# Patient Record
Sex: Female | Born: 1935 | Race: White | Hispanic: No | Smoking: Never smoker
Health system: Southern US, Community
[De-identification: ages and names within clinical notes are randomized; demographics above are authoritative.]

## PROBLEM LIST (undated history)

## (undated) DIAGNOSIS — K449 Diaphragmatic hernia without obstruction or gangrene: Secondary | ICD-10-CM

## (undated) DIAGNOSIS — K222 Esophageal obstruction: Secondary | ICD-10-CM

## (undated) DIAGNOSIS — K219 Gastro-esophageal reflux disease without esophagitis: Secondary | ICD-10-CM

## (undated) DIAGNOSIS — I350 Nonrheumatic aortic (valve) stenosis: Secondary | ICD-10-CM

## (undated) DIAGNOSIS — I209 Angina pectoris, unspecified: Secondary | ICD-10-CM

## (undated) DIAGNOSIS — E039 Hypothyroidism, unspecified: Secondary | ICD-10-CM

## (undated) HISTORY — DX: Gastro-esophageal reflux disease without esophagitis: K21.9

## (undated) HISTORY — DX: Hypothyroidism, unspecified: E03.9

## (undated) HISTORY — DX: Diaphragmatic hernia without obstruction or gangrene: K44.9

## (undated) HISTORY — DX: Esophageal obstruction: K22.2

## (undated) HISTORY — PX: ELBOW SURGERY: SHX618

## (undated) HISTORY — PX: TONSILLECTOMY: SUR1361

## (undated) HISTORY — PX: CHOLECYSTECTOMY: SHX55

## (undated) HISTORY — DX: Angina pectoris, unspecified: I20.9

## (undated) HISTORY — DX: Nonrheumatic aortic (valve) stenosis: I35.0

## (undated) HISTORY — PX: OTHER SURGICAL HISTORY: SHX169

---

## 1966-11-06 HISTORY — PX: ABDOMINAL HYSTERECTOMY: SHX81

## 1998-10-06 LAB — HM COLONOSCOPY

## 2008-07-20 ENCOUNTER — Encounter: Payer: Self-pay | Admitting: Family Medicine

## 2008-08-05 ENCOUNTER — Encounter: Payer: Self-pay | Admitting: Family Medicine

## 2008-10-15 ENCOUNTER — Ambulatory Visit: Payer: Self-pay | Admitting: Family Medicine

## 2008-10-15 ENCOUNTER — Telehealth: Payer: Self-pay | Admitting: Family Medicine

## 2008-10-15 ENCOUNTER — Encounter: Admission: RE | Admit: 2008-10-15 | Discharge: 2008-10-15 | Payer: Self-pay | Admitting: Family Medicine

## 2008-10-15 DIAGNOSIS — M810 Age-related osteoporosis without current pathological fracture: Secondary | ICD-10-CM | POA: Insufficient documentation

## 2008-10-15 DIAGNOSIS — E039 Hypothyroidism, unspecified: Secondary | ICD-10-CM | POA: Insufficient documentation

## 2008-10-15 DIAGNOSIS — R002 Palpitations: Secondary | ICD-10-CM

## 2008-10-15 DIAGNOSIS — M545 Low back pain: Secondary | ICD-10-CM | POA: Insufficient documentation

## 2008-10-15 LAB — CONVERTED CEMR LAB
Ketones, urine, test strip: NEGATIVE
Nitrite: NEGATIVE
Urobilinogen, UA: 0.2
pH: 7

## 2008-10-19 LAB — CONVERTED CEMR LAB
ALT: 11 units/L (ref 0–35)
CO2: 27 meq/L (ref 19–32)
Calcium: 9.2 mg/dL (ref 8.4–10.5)
Chloride: 103 meq/L (ref 96–112)
Creatinine, Ser: 0.76 mg/dL (ref 0.40–1.20)
Folate: >20 ng/mL
Glucose, Bld: 102 mg/dL — ABNORMAL HIGH (ref 70–99)
Iron: 133 ug/dL (ref 42–145)
Saturation Ratios: 38 % (ref 20–55)
Total Bilirubin: 0.8 mg/dL (ref 0.3–1.2)
Vitamin B-12: 213 pg/mL (ref 211–911)

## 2008-11-12 DIAGNOSIS — H269 Unspecified cataract: Secondary | ICD-10-CM | POA: Insufficient documentation

## 2008-11-21 ENCOUNTER — Ambulatory Visit: Payer: Self-pay | Admitting: Occupational Medicine

## 2008-12-16 ENCOUNTER — Ambulatory Visit: Payer: Self-pay | Admitting: Family Medicine

## 2008-12-16 DIAGNOSIS — D510 Vitamin B12 deficiency anemia due to intrinsic factor deficiency: Secondary | ICD-10-CM

## 2008-12-17 ENCOUNTER — Telehealth: Payer: Self-pay | Admitting: Family Medicine

## 2009-02-17 ENCOUNTER — Telehealth: Payer: Self-pay | Admitting: Family Medicine

## 2009-03-03 ENCOUNTER — Ambulatory Visit: Payer: Self-pay | Admitting: Family Medicine

## 2009-03-03 DIAGNOSIS — M79609 Pain in unspecified limb: Secondary | ICD-10-CM

## 2009-03-03 DIAGNOSIS — E559 Vitamin D deficiency, unspecified: Secondary | ICD-10-CM | POA: Insufficient documentation

## 2009-03-08 LAB — CONVERTED CEMR LAB: TSH: 3.515 microintl units/mL (ref 0.350–4.500)

## 2009-03-16 ENCOUNTER — Telehealth (INDEPENDENT_AMBULATORY_CARE_PROVIDER_SITE_OTHER): Payer: Self-pay | Admitting: *Deleted

## 2009-08-25 ENCOUNTER — Ambulatory Visit: Payer: Self-pay | Admitting: Family Medicine

## 2009-08-27 ENCOUNTER — Encounter: Payer: Self-pay | Admitting: Family Medicine

## 2009-11-09 ENCOUNTER — Encounter: Admission: RE | Admit: 2009-11-09 | Discharge: 2009-11-09 | Payer: Self-pay | Admitting: Family Medicine

## 2009-12-27 ENCOUNTER — Ambulatory Visit: Payer: Self-pay | Admitting: Family Medicine

## 2009-12-28 LAB — CONVERTED CEMR LAB
HCT: 43.8 % (ref 36.0–46.0)
Hemoglobin: 14.1 g/dL (ref 12.0–15.0)
MCHC: 32.2 g/dL (ref 30.0–36.0)
MCV: 96.7 fL (ref 78.0–100.0)
Platelets: 170 10*3/uL (ref 150–400)
RBC: 4.53 M/uL (ref 3.87–5.11)
RDW: 13.5 % (ref 11.5–15.5)
TSH: 0.95 microintl units/mL (ref 0.350–4.500)
Vit D, 25-Hydroxy: 32 ng/mL (ref 30–89)
Vitamin B-12: 288 pg/mL (ref 211–911)
WBC: 5.3 10*3/uL (ref 4.0–10.5)

## 2010-06-10 ENCOUNTER — Telehealth: Payer: Self-pay | Admitting: Family Medicine

## 2010-06-10 ENCOUNTER — Ambulatory Visit: Payer: Self-pay | Admitting: Family Medicine

## 2010-06-10 DIAGNOSIS — I10 Essential (primary) hypertension: Secondary | ICD-10-CM | POA: Insufficient documentation

## 2010-06-10 LAB — CONVERTED CEMR LAB
Bilirubin Urine: NEGATIVE
Glucose, Urine, Semiquant: NEGATIVE
Specific Gravity, Urine: 1.015
pH: 7

## 2010-06-11 ENCOUNTER — Encounter: Payer: Self-pay | Admitting: Family Medicine

## 2010-06-13 LAB — CONVERTED CEMR LAB
ALT: 10 units/L (ref 0–35)
AST: 15 units/L (ref 0–37)
Albumin: 4 g/dL (ref 3.5–5.2)
Alkaline Phosphatase: 71 units/L (ref 39–117)
BUN: 16 mg/dL (ref 6–23)
CO2: 27 meq/L (ref 19–32)
Calcium: 9 mg/dL (ref 8.4–10.5)
Chloride: 102 meq/L (ref 96–112)
Creatinine, Ser: 0.81 mg/dL (ref 0.40–1.20)
Glucose, Bld: 109 mg/dL — ABNORMAL HIGH (ref 70–99)
HCT: 42.5 % (ref 36.0–46.0)
Hemoglobin: 13.8 g/dL (ref 12.0–15.0)
MCHC: 32.5 g/dL (ref 30.0–36.0)
MCV: 95.7 fL (ref 78.0–100.0)
Platelets: 170 10*3/uL (ref 150–400)
Potassium: 4.8 meq/L (ref 3.5–5.3)
RBC: 4.44 M/uL (ref 3.87–5.11)
RDW: 13.2 % (ref 11.5–15.5)
Sodium: 138 meq/L (ref 135–145)
TSH: 3.29 microintl units/mL (ref 0.350–4.500)
Total Bilirubin: 0.7 mg/dL (ref 0.3–1.2)
Total Protein: 6.7 g/dL (ref 6.0–8.3)
WBC: 6.5 10*3/uL (ref 4.0–10.5)

## 2010-06-16 ENCOUNTER — Encounter: Payer: Self-pay | Admitting: Family Medicine

## 2010-07-01 ENCOUNTER — Ambulatory Visit: Payer: Self-pay | Admitting: Family Medicine

## 2010-07-07 ENCOUNTER — Ambulatory Visit: Payer: Self-pay | Admitting: Family Medicine

## 2010-07-28 ENCOUNTER — Ambulatory Visit: Payer: Self-pay | Admitting: Family Medicine

## 2010-07-28 DIAGNOSIS — K219 Gastro-esophageal reflux disease without esophagitis: Secondary | ICD-10-CM

## 2010-08-11 ENCOUNTER — Ambulatory Visit: Payer: Self-pay | Admitting: Family Medicine

## 2010-08-11 DIAGNOSIS — R1084 Generalized abdominal pain: Secondary | ICD-10-CM

## 2010-08-12 ENCOUNTER — Encounter: Admission: RE | Admit: 2010-08-12 | Discharge: 2010-08-12 | Payer: Self-pay | Admitting: Family Medicine

## 2010-08-12 LAB — CONVERTED CEMR LAB
Albumin: 4.2 g/dL (ref 3.5–5.2)
Alkaline Phosphatase: 76 units/L (ref 39–117)
BUN: 16 mg/dL (ref 6–23)
Calcium: 9 mg/dL (ref 8.4–10.5)
Eosinophils Absolute: 0.2 10*3/uL (ref 0.0–0.7)
Glucose, Bld: 117 mg/dL — ABNORMAL HIGH (ref 70–99)
HCT: 41.3 % (ref 36.0–46.0)
Hemoglobin: 13.6 g/dL (ref 12.0–15.0)
Lymphs Abs: 1.4 10*3/uL (ref 0.7–4.0)
MCV: 95.8 fL (ref 78.0–100.0)
Monocytes Relative: 12 % (ref 3–12)
Neutrophils Relative %: 65 % (ref 43–77)
Potassium: 4.2 meq/L (ref 3.5–5.3)
RBC: 4.31 M/uL (ref 3.87–5.11)
WBC: 7 10*3/uL (ref 4.0–10.5)

## 2010-08-13 ENCOUNTER — Encounter: Payer: Self-pay | Admitting: Family Medicine

## 2010-08-16 ENCOUNTER — Telehealth: Payer: Self-pay | Admitting: Family Medicine

## 2010-08-19 ENCOUNTER — Ambulatory Visit: Payer: Self-pay | Admitting: Family Medicine

## 2010-08-23 ENCOUNTER — Encounter: Payer: Self-pay | Admitting: Family Medicine

## 2010-08-30 ENCOUNTER — Ambulatory Visit: Payer: Self-pay | Admitting: Family Medicine

## 2010-08-30 ENCOUNTER — Encounter: Payer: Self-pay | Admitting: Family Medicine

## 2010-09-02 ENCOUNTER — Telehealth: Payer: Self-pay | Admitting: Family Medicine

## 2010-09-06 ENCOUNTER — Telehealth: Payer: Self-pay | Admitting: Family Medicine

## 2010-09-07 ENCOUNTER — Telehealth: Payer: Self-pay | Admitting: Family Medicine

## 2010-09-13 ENCOUNTER — Encounter: Payer: Self-pay | Admitting: Family Medicine

## 2010-09-13 DIAGNOSIS — K5909 Other constipation: Secondary | ICD-10-CM

## 2010-09-14 ENCOUNTER — Encounter: Payer: Self-pay | Admitting: Family Medicine

## 2010-09-17 ENCOUNTER — Encounter: Payer: Self-pay | Admitting: Family Medicine

## 2010-11-15 ENCOUNTER — Ambulatory Visit
Admission: RE | Admit: 2010-11-15 | Discharge: 2010-11-15 | Payer: Self-pay | Source: Home / Self Care | Attending: Family Medicine | Admitting: Family Medicine

## 2010-11-15 DIAGNOSIS — R21 Rash and other nonspecific skin eruption: Secondary | ICD-10-CM | POA: Insufficient documentation

## 2010-11-16 ENCOUNTER — Encounter: Payer: Self-pay | Admitting: Family Medicine

## 2010-11-16 LAB — CONVERTED CEMR LAB
BUN: 12 mg/dL (ref 6–23)
CO2: 27 meq/L (ref 19–32)
Calcium: 9.5 mg/dL (ref 8.4–10.5)
Chloride: 99 meq/L (ref 96–112)
Creatinine, Ser: 0.86 mg/dL (ref 0.40–1.20)
Glucose, Bld: 115 mg/dL — ABNORMAL HIGH (ref 70–99)
Hemoglobin: 14 g/dL (ref 12.0–15.0)
Lymphocytes Relative: 17 % (ref 12–46)
MCHC: 31.8 g/dL (ref 30.0–36.0)
Monocytes Absolute: 0.7 10*3/uL (ref 0.1–1.0)
Monocytes Relative: 12 % (ref 3–12)
Neutro Abs: 3.7 10*3/uL (ref 1.7–7.7)
RBC: 4.48 M/uL (ref 3.87–5.11)
TSH: 2.133 microintl units/mL (ref 0.350–4.500)

## 2010-11-27 ENCOUNTER — Encounter: Payer: Self-pay | Admitting: Family Medicine

## 2010-11-28 ENCOUNTER — Ambulatory Visit
Admission: RE | Admit: 2010-11-28 | Discharge: 2010-11-28 | Payer: Self-pay | Source: Home / Self Care | Attending: Family Medicine | Admitting: Family Medicine

## 2010-11-29 ENCOUNTER — Encounter
Admission: RE | Admit: 2010-11-29 | Discharge: 2010-11-29 | Payer: Self-pay | Source: Home / Self Care | Attending: Family Medicine | Admitting: Family Medicine

## 2010-12-06 NOTE — Letter (Signed)
Summary: Generic Letter  [facility]  [address]   [place], [place] [address]   Phone: [phone]  Fax: [phone]    09/14/2010  In regards to: Ms.  Brandy Tran [address] [place], [place]  [address] Member ID: [id number] D.O.B. 12-02-1921  Please consider approving her amitiza two times a day for chronic constipation. Debbora Presto has tried OTC softeners without relief. She has tried miralax adn this caused nausea and tongue burning. She is not a good candidate for daily stimulant therapy.  She had done well with Amitiza in combination with her stool softners.  When her constipation is not well controlled she has alot of abdominal pain and decreased appetite. Please expedite this appeal as her constipation has already required a visit to GI specialty and to Urgent Care. Thus far the amitiza is controlling her symptoms withou side effects.  Sincerely,   [name] MD

## 2010-12-06 NOTE — Miscellaneous (Signed)
  Clinical Lists Changes  Medications: Removed medication of DETROL 1 MG TABS (TOLTERODINE TARTRATE) Take 1 tablet by mouth two times a day Added new medication of VESICARE 5 MG TABS (SOLIFENACIN SUCCINATE) take one tablet by mouth daily - Signed Rx of VESICARE 5 MG TABS (SOLIFENACIN SUCCINATE) take one tablet by mouth daily;  #30 x 3;  Signed;  Entered by: Avon Gully CMA, (AAMA);  Authorized by: [name] MD;  Method used: Electronically to [facility] [phone]*, [address], [place], [place]  [address], Ph: [phone], Fax: [phone]    Prescriptions: VESICARE 5 MG TABS (SOLIFENACIN SUCCINATE) take one tablet by mouth daily  #30 x 3   Entered by:   Avon Gully CMA, (AAMA)   Authorized by:   [name] MD   Signed by:   Avon Gully CMA, Duncan Dull) on 06/16/2010   Method used:   Electronically to        [facility]  [employer] [phone]* (retail)       [address] [place], [place]  [address]       Ph: [phone]       Fax: [phone]   RxID:   [id number]

## 2010-12-06 NOTE — Assessment & Plan Note (Signed)
Summary: Abdominal Pain   Vital Signs:  Patient profile:   77 year old female Height:      66 inches Weight:      192 pounds Pulse rate:   68 / minute BP sitting:   179 / 82  (right arm) Cuff size:   regular  Vitals Entered By: Avon Gully CMA, Duncan Dull) (August 11, 2010 4:14 PM) CC: stomach hurting since last Friday has become worse in the last 2 days, stopped miralax because it made her sick to her stomach,nausea   Primary Care Provider:  [name] MD  CC:  stomach hurting since last Friday has become worse in the last 2 days, stopped miralax because it made her sick to her stomach, and nausea.  History of Present Illness: rt abd pain x several weeks, knot on the rt side. Still having BMs. Using a stool softner. Stopped the miralax last Friday.  Stomach "grips".  N ochills or sweats. Has felt weak for a months. No fever. No hx of diverticulitis. No blood in the stool. Pain in worse in the lower abdomen. Occ radiates into her back.   Current Medications (verified): 1)  Levoxyl 100 Mcg Tabs (Levothyroxine Sodium) .... Take 1 Tablet By Mouth Once A Day 2)  Vitamin E 1000 Unit Crea (Vitamin E) .... One Tablet By Mouth Once Daily 3)  Calcium 600/vitamin D 600-400 Mg-Unit Chew (Calcium Carbonate-Vitamin D) .... One Tablet By Mouth Once Daily 4)  Vitamin D 16109 Unit Caps (Ergocalciferol) .... One Tab By Mouth Once A Week 5)  Nitro-Dur 0.2 Mg/hr Pt24 (Nitroglycerin) .Marland Kitchen.. 1 Patch Daily Alvogan Brand Only*********having Reaction To Current Brand. 6)  3 Wheel Walker. .... Dx. Bilat Leg Pain, Balance Issues, Sob. 7)  Toprol Xl 200 Mg Xr24h-Tab (Metoprolol Succinate) .... Take 1 Tablet By Mouth Once A Day 8)  Losartan Potassium 25 Mg Tabs (Losartan Potassium) .... Take 1 Tablet By Mouth Once A Day in The Am  Allergies (verified): 1)  ! Neosporin Af (Miconazole Nitrate) 2)  ! Neomycin-Polymyxin B Gu (Neomycin-Polymyxin B Gu) 3)  ! Augmentin Es-600  Comments:  Nurse/Medical  Assistant: The patient's medications and allergies were reviewed with the patient and were updated in the Medication and Allergy Lists. Avon Gully CMA, Duncan Dull) (August 11, 2010 4:15 PM)  Past History:  Past Surgical History: Last updated: 11/12/2008 Left hip replacement s/p fracture.  Partial hysterectomy 1968  Past Medical History: Hiatal hernia  Social History: Reviewed history from 12/27/2009 and no changes required. Daughter is very involved in her care. Daughter works at [employer]. Never Smoked  Physical Exam  General:  Well-developed,well-nourished,in no acute distress; alert,appropriate and cooperative throughout examination Head:  Normocephalic and atraumatic without obvious abnormalities. No apparent alopecia or balding. Lungs:  Normal respiratory effort, chest expands symmetrically. Lungs are clear to auscultation, no crackles or wheezes. Heart:  Normal rate and regular rhythm. S1 and S2 normal without gallop, murmur, click, rub or other extra sounds. Abdomen:  soft, normal bowel sounds, no masses, no guarding, no rigidity, no hepatomegaly, and no splenomegaly.  Unable to palpate andabdominal wasll hernia.  Tender diffusely.  Skin:  no rashes.   Cervical Nodes:  No lymphadenopathy noted Psych:  Cognition and judgment appear intact. Alert and cooperative with normal attention span and concentration. No apparent delusions, illusions, hallucinations   Impression & Recommendations:  Problem # 1:  ABDOMINAL PAIN, GENERALIZED (ICD-789.07) Discussed that I am most concerned about diverticulitis. Will get CBC , CMP and schedule for  abdonminal CT. UA with + blood and LE so will send a cultlure Will start metronidazole in the meantime. Can use Tyelnol for pain. Go to ED if sxs worsen.  Orders: T-CBC w/Diff ([id number]) T-Comprehensive Metabolic Panel [phone]) T-CT Abdomen w/ Dye (74160TC) T-Urine Culture (Spectrum Order) ([id number]) UA Dipstick w/o Micro  (automated)  (81003)  Complete Medication List: 1)  Levoxyl 100 Mcg Tabs (Levothyroxine sodium) .... Take 1 tablet by mouth once a day 2)  Vitamin E 1000 Unit Crea (Vitamin e) .... One tablet by mouth once daily 3)  Calcium 600/vitamin D 600-400 Mg-unit Chew (Calcium carbonate-vitamin d) .... One tablet by mouth once daily 4)  Vitamin D 96295 Unit Caps (Ergocalciferol) .... One tab by mouth once a week 5)  Nitro-dur 0.2 Mg/hr Pt24 (Nitroglycerin) .Marland Kitchen.. 1 patch daily alvogan brand only*********having reaction to current brand. 6)  3 Wheel Walker.  .... Dx. bilat leg pain, balance issues, sob. 7)  Toprol Xl 200 Mg Xr24h-tab (Metoprolol succinate) .... Take 1 tablet by mouth once a day 8)  Losartan Potassium 25 Mg Tabs (Losartan potassium) .... Take 1 tablet by mouth once a day in the am 9)  Metronidazole 500 Mg Tabs (Metronidazole) .... Take 1 tablet by mouth two times a day for 10 days   Patient Instructions: 1)  Go to ED if gets starts vomiting or pain gets worse or spikes a fever.  2)  We will schedule your CT for tomorrow.  Prescriptions: METRONIDAZOLE 500 MG TABS (METRONIDAZOLE) Take 1 tablet by mouth two times a day for 10 days  #20 x 0   Entered and Authorized by:   [name] MD   Signed by:   [name] MD on 08/11/2010   Method used:   Electronically to        [facility] [phone]* (retail)       [address] [place], [place]  [address]       Ph: [phone]       Fax: [phone]   RxID:   [id number]   Appended Document: Abdominal Pain    Clinical Lists Changes  Orders: Added new Service order of UA Dipstick w/o Micro (automated)  (81003) - Signed Observations: Added new observation of WBC DIPSTK U: small (08/16/2010 13:52) Added new observation of NITRITE URN: negative (08/16/2010 13:52) Added new observation of UROBILINOGEN: 0.2  (08/16/2010 13:52) Added new observation of PROTEIN, URN: negative  (08/16/2010 13:52) Added new  observation of PH URINE: 5.5  (08/16/2010 13:52) Added new observation of BLOOD UR DIP: small  (08/16/2010 13:52) Added new observation of SPEC GR URIN: 1.020  (08/16/2010 13:52) Added new observation of KETONES URN: negative  (08/16/2010 13:52) Added new observation of BILIRUBIN UR: negative  (08/16/2010 13:52) Added new observation of GLUCOSE, URN: negative  (08/16/2010 13:52) Added new observation of APPEARANCE U: Clear  (08/16/2010 13:52) Added new observation of UA COLOR: yellow  (08/16/2010 13:52)      Laboratory Results   Urine Tests  Date/Time Received: 08/11/10 Date/Time Reported: 08/11/10  Routine Urinalysis   Color: yellow Appearance: Clear Glucose: negative   (Normal Range: Negative) Bilirubin: negative   (Normal Range: Negative) Ketone: negative   (Normal Range: Negative) Spec. Gravity: 1.020   (Normal Range: 1.003-1.035) Blood: small   (Normal Range: Negative) pH: 5.5   (Normal Range: 5.0-8.0) Protein: negative   (Normal Range: Negative) Urobilinogen: 0.2   (Normal Range: 0-1) Nitrite: negative   (Normal Range:  Negative) Leukocyte Esterace: small   (Normal Range: Negative)

## 2010-12-06 NOTE — Assessment & Plan Note (Signed)
Summary: 1 week f/u  abdminal Pain.   Vital Signs:  Patient profile:   77 year old female Height:      66 inches Weight:      188 pounds Pulse rate:   55 / minute BP sitting:   187 / 93  (right arm) Cuff size:   regular  Vitals Entered By: Avon Gully CMA, Duncan Dull) (August 19, 2010 8:14 AM) CC: abd pain,nausea, cant eat   Primary Care Provider:  [name] MD  CC:  abd pain, nausea, and cant eat.  History of Present Illness: abd pain,nausea, cant eat.  She is on both antibiotic. Still nauseated and did have one day where vomited all day. No fever. Still can't eat well. Started teh dexilant 30mg  that I called in and it made her tongue burn.   Has been drinking alot of water. No fever or chills. Also stopped her miralax because it started to make her tongue burn as well but she has been on this for a long time. She is at least taking her stool softerners.   She has not taken her BP meds this AM since she didn't feel well.   Current Medications (verified): 1)  Levoxyl 100 Mcg Tabs (Levothyroxine Sodium) .... Take 1 Tablet By Mouth Once A Day 2)  Vitamin E 1000 Unit Crea (Vitamin E) .... One Tablet By Mouth Once Daily 3)  Calcium 600/vitamin D 600-400 Mg-Unit Chew (Calcium Carbonate-Vitamin D) .... One Tablet By Mouth Once Daily 4)  Vitamin D 86578 Unit Caps (Ergocalciferol) .... One Tab By Mouth Once A Week 5)  Nitro-Dur 0.2 Mg/hr Pt24 (Nitroglycerin) .Marland Kitchen.. 1 Patch Daily Alvogan Brand Only*********having Reaction To Current Brand. 6)  3 Wheel Walker. .... Dx. Bilat Leg Pain, Balance Issues, Sob. 7)  Toprol Xl 200 Mg Xr24h-Tab (Metoprolol Succinate) .... Take 1 Tablet By Mouth Once A Day 8)  Losartan Potassium 25 Mg Tabs (Losartan Potassium) .... Take 1 Tablet By Mouth Once A Day in The Am 9)  Metronidazole 500 Mg Tabs (Metronidazole) .... Take 1 Tablet By Mouth Two Times A Day For 10 Days 10)  Bactrim Ds 800-160 Mg Tabs (Sulfamethoxazole-Trimethoprim) .... Take 1 Tablet By  Mouth Two Times A Day For 10 Days 11)  Dexilant 30 Mg Cpdr (Dexlansoprazole) .... Take 1 Tablet By Mouth Once A Day  Allergies (verified): 1)  ! Neosporin Af (Miconazole Nitrate) 2)  ! Neomycin-Polymyxin B Gu (Neomycin-Polymyxin B Gu) 3)  ! Augmentin Es-600  Comments:  Nurse/Medical Assistant: The patient's medications and allergies were reviewed with the patient and were updated in the Medication and Allergy Lists. Avon Gully CMA, Duncan Dull) (August 19, 2010 8:15 AM)  Physical Exam  General:  Well-developed,well-nourished,in no acute distress; alert,appropriate and cooperative throughout examination Lungs:  Normal respiratory effort, chest expands symmetrically. Lungs are clear to auscultation, no crackles or wheezes. Heart:  Normal rate and regular rhythm. S1 and S2 normal without gallop, murmur, click, rub or other extra sounds. Abdomen:  soft, normal bowel sounds, no hepatomegaly, and no splenomegaly.  Mildy tender over the abdomen.  Skin:  no rashes.   Psych:  Cognition and judgment appear intact. Alert and cooperative with normal attention span and concentration. No apparent delusions, illusions, hallucinations   Impression & Recommendations:  Problem # 1:  ABDOMINAL PAIN, GENERALIZED (ICD-789.07) Did have what look like inflammed peice of bowel on CT of the abdomen she is on the ABX adn feels her Abodminal pain is some better but still very nauseated.  Unsure if nausea from her bowels or from the antibiotic.  Will refer to GI for further evaluation.  Not sure why she is having tongue burning. She will complete her ABX on Sunday.  Orders: Gastroenterology Referral (GI)  Problem # 2:  ANEMIA, PERNICIOUS (ICD-281.0) Overdue for B12 injection. Given today.   Complete Medication List: 1)  Levoxyl 100 Mcg Tabs (Levothyroxine sodium) .... Take 1 tablet by mouth once a day 2)  Vitamin E 1000 Unit Crea (Vitamin e) .... One tablet by mouth once daily 3)  Calcium 600/vitamin D  600-400 Mg-unit Chew (Calcium carbonate-vitamin d) .... One tablet by mouth once daily 4)  Vitamin D 04540 Unit Caps (Ergocalciferol) .... One tab by mouth once a week 5)  Nitro-dur 0.2 Mg/hr Pt24 (Nitroglycerin) .Marland Kitchen.. 1 patch daily alvogan brand only*********having reaction to current brand. 6)  Toprol Xl 200 Mg Xr24h-tab (Metoprolol succinate) .... Take 1 tablet by mouth once a day 7)  Losartan Potassium 25 Mg Tabs (Losartan potassium) .... Take 1 tablet by mouth once a day in the am 8)  Metronidazole 500 Mg Tabs (Metronidazole) .... Take 1 tablet by mouth two times a day for 10 days 9)  Bactrim Ds 800-160 Mg Tabs (Sulfamethoxazole-trimethoprim) .... Take 1 tablet by mouth two times a day for 10 days  Other Orders: Vit B12 1000 mcg (J3420) Admin of Therapeutic Inj (IM or [place]) ([address])  Patient Instructions: 1)  We will call you with the GI referral.       Medication Administration  Injection # 1:    Medication: Vit B12 1000 mcg    Diagnosis: UNSPECIFIED VITAMIN D DEFICIENCY (ICD-268.9)    Route: IM    Site: RUOQ gluteus    Exp Date: 03/06/2012    Lot #: [id number]    Mfr: [employer]    Patient tolerated injection without complications    Given by: Avon Gully CMA, Duncan Dull) (August 19, 2010 8:53 AM)  Orders Added: 1)  Vit B12 1000 mcg [J3420] 2)  Admin of Therapeutic Inj (IM or [place]) [[address]] 3)  Gastroenterology Referral [GI] 4)  Est. Patient Level III [86578]

## 2010-12-06 NOTE — Assessment & Plan Note (Signed)
Summary: MED REFILL AND BLOOD WORK   Vital Signs:  Patient profile:   77 year old female Height:      66 inches Weight:      202 pounds Pulse rate:   66 / minute BP sitting:   131 / 72  (left arm) Cuff size:   large  Vitals Entered By: Kathlene November (December 27, 2009 9:11 AM) CC: check-up- would like a walker- discuss her vitamin d-   Primary Care Provider:  [name] MD  CC:  check-up- would like a walker- discuss her vitamin d-.  History of Present Illness: check-up- would like a walker- discuss her vitamin d-  Feel like loses her balance and occ feels SOB.  Not longer driving. Would like a walker with the 3 wheels.   Both ears are stopped  up for about a month. Slight pain in her right ear. No fever or drainge. Some hearing loss.   Hasn't had her B12 shot in m,onths.   Habits & Providers  Alcohol-Tobacco-Diet     Tobacco Status: never  Current Medications (verified): 1)  Levoxyl 100 Mcg Tabs (Levothyroxine Sodium) .... Take 1 Tablet By Mouth Once A Day 2)  Vitamin E 1000 Unit Crea (Vitamin E) .... One Tablet By Mouth Once Daily 3)  Calcium 600/vitamin D 600-400 Mg-Unit Chew (Calcium Carbonate-Vitamin D) .... One Tablet By Mouth Once Daily 4)  Actonel 5 Mg Tabs (Risedronate Sodium) .... Take 1 Tablet By Mouth Once A Day 5)  Vitamin D 04540 Unit Caps (Ergocalciferol) .... One Tab By Mouth Once A Week 6)  Nitro-Dur 0.2 Mg/hr Pt24 (Nitroglycerin) .Marland Kitchen.. 1 Patch Daily 7)  Vitamin B-12 2500 Mcg Subl (Cyanocobalamin) 8)  Omeprazole 20 Mg Cpdr (Omeprazole) .Marland Kitchen.. 1 Tab By Mouth Once Daily  Allergies (verified): 1)  ! Neosporin Af (Miconazole Nitrate) 2)  ! Neomycin-Polymyxin B Gu (Neomycin-Polymyxin B Gu) 3)  ! Augmentin Es-600  Comments:  Nurse/Medical Assistant: The patient's medications and allergies were reviewed with the patient and were updated in the Medication and Allergy Lists. Kathlene November (December 27, 2009 9:12 AM)  Past History:  Past Surgical  History: Last updated: 11/12/2008 Left hip replacement s/p fracture.  Partial hysterectomy 1968  Social History: Daughter is very involved in her care. Daughter works at [employer]. Never Smoked  Physical Exam  General:  Well-developed,well-nourished,in no acute distress; alert,appropriate and cooperative throughout examination Head:  Normocephalic and atraumatic without obvious abnormalities. No apparent alopecia or balding. Eyes:  No corneal or conjunctival inflammation noted. EOMI.  Ears:  Right ear is blocked with cerumen. LEft ear and TM are clear.  Neck:  No deformities, masses, or tenderness noted. Lungs:  Normal respiratory effort, chest expands symmetrically. Lungs are clear to auscultation, no crackles or wheezes. Heart:  3/6 SEM best heard at the left sternal border.  Msk:  Use her arms to push up out of a chair and has to "rock" to get the momentum to get up.  Pulses:  Radial 2+  Neurologic:  alert & oriented X3.   Skin:  no rashes.   Psych:  Cognition and judgment appear intact. Alert and cooperative with normal attention span and concentration. No apparent delusions, illusions, hallucinations   Impression & Recommendations:  Problem # 1:  HYPOTHYROIDISM (ICD-244.9) Due to recheck. Doing well.  Her updated medication list for this problem includes:    Levoxyl 100 Mcg Tabs (Levothyroxine sodium) .Marland Kitchen... Take 1 tablet by mouth once a day  Orders: T-TSH ([id number])  Problem #  2:  LEG PAIN, BILATERAL (ICD-729.5) Will write rx for walker. Daughter to drop off handicap placard for driving.   Problem # 3:  ANEMIA, PERNICIOUS (ICD-281.0) Since has missed several doses of her injection will check level today and give her her next shot.  Her updated medication list for this problem includes:    Vitamin B-12 2500 Mcg Subl (Cyanocobalamin)  Orders: T-CBC No Diff ([id number]) T-Vitamin B12 (09811-91478) Vit B12 1000 mcg (J3420) Admin of Therapeutic Inj  intramuscular or  subcutaneous (29562)  Problem # 4:  UNSPECIFIED VITAMIN D DEFICIENCY (ICD-268.9) Says the vit D really helps her back pain so will refill and check her level today.  Orders: T-Vitamin D (25-Hydroxy) [phone])  Problem # 5:  CERUMEN IMPACTION, RIGHT (ICD-380.4)  Attempted irrigation. Unsuccessful to manual disimpaction performed.   Orders: Cerumen Impaction Removal (96295)  Complete Medication List: 1)  Levoxyl 100 Mcg Tabs (Levothyroxine sodium) .... Take 1 tablet by mouth once a day 2)  Vitamin E 1000 Unit Crea (Vitamin e) .... One tablet by mouth once daily 3)  Calcium 600/vitamin D 600-400 Mg-unit Chew (Calcium carbonate-vitamin d) .... One tablet by mouth once daily 4)  Actonel 5 Mg Tabs (Risedronate sodium) .... Take 1 tablet by mouth once a day 5)  Vitamin D 28413 Unit Caps (Ergocalciferol) .... One tab by mouth once a week 6)  Nitro-dur 0.2 Mg/hr Pt24 (Nitroglycerin) .Marland Kitchen.. 1 patch daily 7)  Vitamin B-12 2500 Mcg Subl (Cyanocobalamin) 8)  Omeprazole 20 Mg Cpdr (Omeprazole) .Marland Kitchen.. 1 tab by mouth once daily 9)  3 Wheel Walker.  .... Dx. bilat leg pain, balance issues, sob. Prescriptions: 3 WHEEL WALKER. Dx. Bilat leg pain, balance issues, SOB.  #1 x 0   Entered and Authorized by:   [name] MD   Signed by:   [name] MD on 12/27/2009   Method used:   Print then Give to Patient   RxID:   [id number] VITAMIN D 34742 UNIT CAPS (ERGOCALCIFEROL) one tab by mouth once a week  #4 Capsule x 3   Entered and Authorized by:   [name] MD   Signed by:   [name] MD on 12/27/2009   Method used:   Electronically to        [facility] [phone]* (retail)       [address] [place], [place]  [address]       Ph: [phone]       Fax: [phone]   RxID:   [id number]    Medication Administration  Injection # 1:    Medication: Vit B12 1000 mcg    Diagnosis: ANEMIA, PERNICIOUS (ICD-281.0)    Route: IM    Site: RUOQ gluteus     Exp Date: 08/07/2011    Lot #: [id number]    Mfr: [employer]    Patient tolerated injection without complications    Given by: Kathlene November (December 27, 2009 10:07 AM)  Orders Added: 1)  T-TSH [[id number]] 2)  T-CBC No Diff [[id number]] 3)  T-Vitamin D (25-Hydroxy) [62694-85462] 4)  T-Vitamin B12 [82607-23330] 5)  Vit B12 1000 mcg [J3420] 6)  Admin of Therapeutic Inj  intramuscular or subcutaneous [96372] 7)  Cerumen Impaction Removal [69210] 8)  Est. Patient Level IV [70350]

## 2010-12-06 NOTE — Miscellaneous (Signed)
  Clinical Lists Changes  Problems: Added new problem of CONSTIPATION, CHRONIC (ICD-564.09)

## 2010-12-06 NOTE — Progress Notes (Signed)
Summary: Amitiza  Phone Note From Pharmacy   Summary of Call: Insurance is not going to cover Amitiza. is there anything eles you want to rx? Initial call taken by: Avon Gully CMA, Duncan Dull),  September 07, 2010 3:53 PM Caller: [facility]  [place] [phone]* Summary of Call: Insuranc is requiring  a PA for Amitiza . Is there anything eles you want to rx? Initial call taken by: Avon Gully CMA, Duncan Dull),  September 07, 2010 3:54 PM  Follow-up for Phone Call        No let do the prior auth. She has alreayd tried stimulants and  miralax wtihout success.  Follow-up by: [name] MD,  September 08, 2010 9:44 AM  Additional Follow-up for Phone Call Additional follow up Details #1::        PA initiated.they will fax form with desicion Additional Follow-up by: Avon Gully CMA, Duncan Dull),  September 08, 2010 4:08 PM

## 2010-12-06 NOTE — Assessment & Plan Note (Signed)
Summary: htn, gerd   Vital Signs:  Patient profile:   77 year old female Height:      66 inches Weight:      192 pounds Pulse rate:   90 / minute BP sitting:   191 / 90  (right arm) Cuff size:   regular  Vitals Entered By: Avon Gully CMA, Duncan Dull) (July 28, 2010 8:11 AM) CC: f/u BP  pt states her the am BP med makes her feel sick   Primary Care Pamula Luther:  [name] MD  CC:  f/u BP  pt states her the am BP med makes her feel sick.  History of Present Illness: Says feels the lisinopril is making her feel nauseated and dizzy. Still taking her metoprolol and it does make her feel a little better. No othe rSE. Didn't take her pill this AM. Says overall her reflux is worse. She is on famotidine currently and has a hx of a hiatal hernia. No vomiting or bowel changes.    Current Medications (verified): 1)  Levoxyl 100 Mcg Tabs (Levothyroxine Sodium) .... Take 1 Tablet By Mouth Once A Day 2)  Vitamin E 1000 Unit Crea (Vitamin E) .... One Tablet By Mouth Once Daily 3)  Calcium 600/vitamin D 600-400 Mg-Unit Chew (Calcium Carbonate-Vitamin D) .... One Tablet By Mouth Once Daily 4)  Vitamin D 45409 Unit Caps (Ergocalciferol) .... One Tab By Mouth Once A Week 5)  Nitro-Dur 0.2 Mg/hr Pt24 (Nitroglycerin) .Marland Kitchen.. 1 Patch Daily Alvogan Brand Only*********having Reaction To Current Brand. 6)  Vitamin B-12 2500 Mcg Subl (Cyanocobalamin) 7)  3 Wheel Walker. .... Dx. Bilat Leg Pain, Balance Issues, Sob. 8)  Toprol Xl 200 Mg Xr24h-Tab (Metoprolol Succinate) .... Take 1 Tablet By Mouth Once A Day 9)  Famotidine 20 Mg Tabs (Famotidine) .... Take 1 Tablet By Mouth Two Times A Day 10)  Lisinopril-Hydrochlorothiazide 20-12.5 Mg Tabs (Lisinopril-Hydrochlorothiazide) .... Take 2  Tablet By Mouth Once A Day in The Am.  Allergies (verified): 1)  ! Neosporin Af (Miconazole Nitrate) 2)  ! Neomycin-Polymyxin B Gu (Neomycin-Polymyxin B Gu) 3)  ! Augmentin Es-600  Comments:  Nurse/Medical  Assistant: The patient's medications and allergies were reviewed with the patient and were updated in the Medication and Allergy Lists. Avon Gully CMA, Duncan Dull) (July 28, 2010 8:14 AM)  Physical Exam  General:  Well-developed,well-nourished,in no acute distress; alert,appropriate and cooperative throughout examination   Impression & Recommendations:  Problem # 1:  HYPERTENSION, MILD (ICD-401.1) Will change to losartan to see if tolerates better.  F/U in  months to recheck.  Her updated medication list for this problem includes:    Toprol Xl 200 Mg Xr24h-tab (Metoprolol succinate) .Marland Kitchen... Take 1 tablet by mouth once a day    Losartan Potassium 25 Mg Tabs (Losartan potassium) .Marland Kitchen... Take 1 tablet by mouth once a day in the am  Problem # 2:  GERD (ICD-530.81) Given samples of dexilant to try for 2 weeks. Call and let me know if helping. If not then refer to GI for possible endoscopy to rule out barrets or other changes.  This may also help her tolerance of the BP Medications.  Her updated medication list for this problem includes:    Famotidine 20 Mg Tabs (Famotidine) .Marland Kitchen... Take 1 tablet by mouth two times a day  Complete Medication List: 1)  Levoxyl 100 Mcg Tabs (Levothyroxine sodium) .... Take 1 tablet by mouth once a day 2)  Vitamin E 1000 Unit Crea (Vitamin e) .... One tablet by  mouth once daily 3)  Calcium 600/vitamin D 600-400 Mg-unit Chew (Calcium carbonate-vitamin d) .... One tablet by mouth once daily 4)  Vitamin D 04540 Unit Caps (Ergocalciferol) .... One tab by mouth once a week 5)  Nitro-dur 0.2 Mg/hr Pt24 (Nitroglycerin) .Marland Kitchen.. 1 patch daily alvogan brand only*********having reaction to current brand. 6)  Vitamin B-12 2500 Mcg Subl (Cyanocobalamin) 7)  3 Wheel Walker.  .... Dx. bilat leg pain, balance issues, sob. 8)  Toprol Xl 200 Mg Xr24h-tab (Metoprolol succinate) .... Take 1 tablet by mouth once a day 9)  Famotidine 20 Mg Tabs (Famotidine) .... Take 1 tablet by mouth  two times a day 10)  Losartan Potassium 25 Mg Tabs (Losartan potassium) .... Take 1 tablet by mouth once a day in the am  Other Orders: Influenza Vaccine MCR ([id number])  Patient Instructions: 1)  Please schedule a follow-up appointment in 4 weeks.  2)  Call me and let me know if dexilant is helping your reflus in 2 weeks. Hold the famotidine. If working will call in something similar to your pharmacy.  3)  Stop the lisinopril and change to losartan.  Prescriptions: LOSARTAN POTASSIUM 25 MG TABS (LOSARTAN POTASSIUM) Take 1 tablet by mouth once a day in the AM  #30 x 1   Entered and Authorized by:   [name] MD   Signed by:   [name] MD on 07/28/2010   Method used:   Electronically to        [facility] [phone]* (retail)       [address] [place], [place]  [address]       Ph: [phone]       Fax: [phone]   RxID:   [id number]    Immunizations Administered:  Influenza Vaccine # 1:    Vaccine Type: Fluvax MCR    Site: left deltoid    Mfr: GlaxoSmithKline    Dose: 0.5 ml    Route: IM    Given by: Sue Lush McCrimmon CMA, (AAMA)    Exp. Date: 05/06/2011    Lot #: [id number]    VIS given: 05/31/10 version given July 28, 2010.  Flu Vaccine Consent Questions:    Do you have a history of severe allergic reactions to this vaccine? no    Any prior history of allergic reactions to egg and/or gelatin? no    Do you have a sensitivity to the preservative Thimersol? no    Do you have a past history of Guillan-Barre Syndrome? no    Do you currently have an acute febrile illness? no    Have you ever had a severe reaction to latex? no    Vaccine information given and explained to patient? no    Are you currently pregnant? no

## 2010-12-06 NOTE — Assessment & Plan Note (Signed)
Summary: 1 mo f/u HTN, abdm pain   Vital Signs:  Patient profile:   77 year old female Height:      66 inches Weight:      191 pounds Pulse rate:   68 / minute BP sitting:   187 / 93  (right arm) Cuff size:   regular  Vitals Entered By: Avon Gully CMA, Duncan Dull) (August 30, 2010 8:07 AM) CC: F/u BP pt states the Metoprolol does make er feel better   Primary Care Provider:  [name] MD  CC:  F/u BP pt states the Metoprolol does make er feel better.  History of Present Illness: F/u BP pt states the Metoprolol does make er feel better. Did take her BP this AM.    Saw GI for her abdominal pain and started on amitiza daily.  She has been taking it daily and does feel her ABdomen is better but no BM. Had to go to [facility] yesterday to be disimpacted.  Says she does feel better today.   Current Medications (verified): 1)  Levoxyl 100 Mcg Tabs (Levothyroxine Sodium) .... Take 1 Tablet By Mouth Once A Day 2)  Vitamin E 1000 Unit Crea (Vitamin E) .... One Tablet By Mouth Once Daily 3)  Calcium 600/vitamin D 600-400 Mg-Unit Chew (Calcium Carbonate-Vitamin D) .... One Tablet By Mouth Once Daily 4)  Vitamin D 51025 Unit Caps (Ergocalciferol) .... One Tab By Mouth Once A Week 5)  Nitro-Dur 0.2 Mg/hr Pt24 (Nitroglycerin) .Marland Kitchen.. 1 Patch Daily Alvogan Brand Only*********having Reaction To Current Brand. 6)  Toprol Xl 200 Mg Xr24h-Tab (Metoprolol Succinate) .... Take 1 Tablet By Mouth Once A Day 7)  Losartan Potassium 25 Mg Tabs (Losartan Potassium) .... Take 1 Tablet By Mouth Once A Day in The Am  Allergies (verified): 1)  ! Neosporin Af (Miconazole Nitrate) 2)  ! Neomycin-Polymyxin B Gu (Neomycin-Polymyxin B Gu) 3)  ! Augmentin Es-600  Comments:  Nurse/Medical Assistant: The patient's medications and allergies were reviewed with the patient and were updated in the Medication and Allergy Lists. Avon Gully CMA, Duncan Dull) (August 30, 2010 8:09 AM)  Physical  Exam  General:  Well-developed,well-nourished,in no acute distress; alert,appropriate and cooperative throughout examination Head:  Normocephalic and atraumatic without obvious abnormalities. No apparent alopecia or balding. Lungs:  Normal respiratory effort, chest expands symmetrically. Lungs are clear to auscultation, no crackles or wheezes. Heart:  4/6 SEM, RRR.  Abdomen:  Bowel sounds positive,abdomen soft and non-tender without masses, organomegaly or hernias noted.   Impression & Recommendations:  Problem # 1:  ABDOMINAL PAIN, GENERALIZED (ICD-789.07) Much imrpved. Will inc her amitiza dose to since tolerating well and since no BM on the daily.  Problem # 2:  HYPERTENSION, MILD (ICD-401.1) Still not at goal but took meds about 30 min ago. Repeat BP was better. I suspect in an hour her BP will be much better. Doesn't check BP at home. Says the toprol makes her feel better.  Her updated medication list for this problem includes:    Toprol Xl 200 Mg Xr24h-tab (Metoprolol succinate) .Marland Kitchen... Take 1 tablet by mouth once a day    Losartan Potassium 25 Mg Tabs (Losartan potassium) .Marland Kitchen... Take 1 tablet by mouth once a day in the am  Complete Medication List: 1)  Levoxyl 100 Mcg Tabs (Levothyroxine sodium) .... Take 1 tablet by mouth once a day 2)  Vitamin E 1000 Unit Crea (Vitamin e) .... One tablet by mouth once daily 3)  Calcium 600/vitamin D  600-400 Mg-unit Chew (Calcium carbonate-vitamin d) .... One tablet by mouth once daily 4)  Vitamin D 74259 Unit Caps (Ergocalciferol) .... One tab by mouth once a week 5)  Nitro-dur 0.2 Mg/hr Pt24 (Nitroglycerin) .Marland Kitchen.. 1 patch daily alvogan brand only*********having reaction to current brand. 6)  Toprol Xl 200 Mg Xr24h-tab (Metoprolol succinate) .... Take 1 tablet by mouth once a day 7)  Losartan Potassium 25 Mg Tabs (Losartan potassium) .... Take 1 tablet by mouth once a day in the am  Patient Instructions: 1)  Amitza two times a day   2)  Call if not BM by Fridays.  3)  Please schedule a follow-up appointment in 3 months for blood pressure .    Orders Added: 1)  Est. Patient Level III [[id number]]

## 2010-12-06 NOTE — Medication Information (Signed)
Summary: Approval for Amitiza/[employer]  Approval for Amitiza/[employer]   Imported By: Lanelle Bal 10/03/2010 09:00:41  _____________________________________________________________________  External Attachment:    Type:   Image     Comment:   External Document

## 2010-12-06 NOTE — Assessment & Plan Note (Signed)
Summary: 3 week f/u HTN   Vital Signs:  Patient profile:   77 year old female Height:      66 inches Weight:      196 pounds Pulse rate:   73 / minute BP sitting:   218 / 100  (right arm) Cuff size:   regular  Vitals Entered By: Avon Gully CMA, Duncan Dull) (July 01, 2010 8:12 AM)  Serial Vital Signs/Assessments:  Time      Position  BP       Pulse  Resp  Temp     By 8:13 AM             210/100                        Avon Gully CMA, (AAMA) 9:32 AM             190/94                         Avon Gully CMA, (AAMA)  Comments: 8:13 AM second BP was done manual, thrid  By: Avon Gully CMA, Duncan Dull)    Primary Care Provider:  [name] MD   History of Present Illness: She got her brand only nitro patches and her allergic rash has almost completly resolved. She is feeling much better overall.  She did start the metoprolol but didnt take it this AM. She notices her heart rate speeds up when she is ambulatory.  Some SOB with walking long distances, but no chest pain.   Current Medications (verified): 1)  Levoxyl 100 Mcg Tabs (Levothyroxine Sodium) .... Take 1 Tablet By Mouth Once A Day 2)  Vitamin E 1000 Unit Crea (Vitamin E) .... One Tablet By Mouth Once Daily 3)  Calcium 600/vitamin D 600-400 Mg-Unit Chew (Calcium Carbonate-Vitamin D) .... One Tablet By Mouth Once Daily 4)  Actonel 5 Mg Tabs (Risedronate Sodium) .... Take 1 Tablet By Mouth Once A Day 5)  Vitamin D 47829 Unit Caps (Ergocalciferol) .... One Tab By Mouth Once A Week 6)  Nitro-Dur 0.2 Mg/hr Pt24 (Nitroglycerin) .Marland Kitchen.. 1 Patch Daily Alvogan Brand Only*********having Reaction To Current Brand. 7)  Vitamin B-12 2500 Mcg Subl (Cyanocobalamin) 8)  3 Wheel Walker. .... Dx. Bilat Leg Pain, Balance Issues, Sob. 9)  Toprol Xl 50 Mg Xr24h-Tab (Metoprolol Succinate) .... Take 1 Tablet By Mouth Once A Day 10)  Ciprofloxacin Hcl 500 Mg Tabs (Ciprofloxacin Hcl) .... Take 1 Tablet By Mouth Two Times A Day  For 3 Days 11)  Vesicare 5 Mg Tabs (Solifenacin Succinate) .... Take One Tablet By Mouth Daily  Allergies (verified): 1)  ! Neosporin Af (Miconazole Nitrate) 2)  ! Neomycin-Polymyxin B Gu (Neomycin-Polymyxin B Gu) 3)  ! Augmentin Es-600  Comments:  Nurse/Medical Assistant: The patient's medications and allergies were reviewed with the patient and were updated in the Medication and Allergy Lists. Avon Gully CMA, Duncan Dull) (July 01, 2010 8:13 AM)  Physical Exam  General:  Well-developed,well-nourished,in no acute distress; alert,appropriate and cooperative throughout examination Lungs:  Normal respiratory effort, chest expands symmetrically. Lungs are clear to auscultation, no crackles or wheezes. Heart:  Normal rate and regular rhythm. S1 and S2 normal without gallop, murmur, click, rub or other extra sounds. Psych:  Cognition and judgment appear intact. Alert and cooperative with normal attention span and concentration. No apparent delusions, illusions, hallucinations   Impression & Recommendations:  Problem # 1:  HYPERTENSION, MILD (ICD-401.1) Repeat BP  better. She brought her meds with her so had her take her Toprol this AM.  Discussed needs to take her medicatios before comes in next week for f/u. Add ACE plus diuretic and inc Toprol to 100mg  tab to help control her HR.  If still having tachycardia and SOB with activity after reducing her BP next week then will refer to Cardiology for echo +/- stress testing. Discussed potential SE. Her updated medication list for this problem includes:    Toprol Xl 100 Mg Xr24h-tab (Metoprolol succinate) .Marland Kitchen... Take 1 tablet by mouth once a day    Lisinopril-hydrochlorothiazide 20-12.5 Mg Tabs (Lisinopril-hydrochlorothiazide) .Marland Kitchen... Take 1 tablet by mouth once a day in the am.  Problem # 2:  SKIN RASH (ICD-782.1) REsolving now back on the brand nitro patch.   Complete Medication List: 1)  Levoxyl 100 Mcg Tabs (Levothyroxine sodium) .... Take  1 tablet by mouth once a day 2)  Vitamin E 1000 Unit Crea (Vitamin e) .... One tablet by mouth once daily 3)  Calcium 600/vitamin D 600-400 Mg-unit Chew (Calcium carbonate-vitamin d) .... One tablet by mouth once daily 4)  Actonel 5 Mg Tabs (Risedronate sodium) .... Take 1 tablet by mouth once a day 5)  Vitamin D 16109 Unit Caps (Ergocalciferol) .... One tab by mouth once a week 6)  Nitro-dur 0.2 Mg/hr Pt24 (Nitroglycerin) .Marland Kitchen.. 1 patch daily alvogan brand only*********having reaction to current brand. 7)  Vitamin B-12 2500 Mcg Subl (Cyanocobalamin) 8)  3 Wheel Walker.  .... Dx. bilat leg pain, balance issues, sob. 9)  Toprol Xl 100 Mg Xr24h-tab (Metoprolol succinate) .... Take 1 tablet by mouth once a day 10)  Vesicare 5 Mg Tabs (Solifenacin succinate) .... Take one tablet by mouth daily 11)  Famotidine 20 Mg Tabs (Famotidine) .... Take 1 tablet by mouth two times a day 12)  Lisinopril-hydrochlorothiazide 20-12.5 Mg Tabs (Lisinopril-hydrochlorothiazide) .... Take 1 tablet by mouth once a day in the am.  Patient Instructions: 1)  Start the lisinopril HCT in the AM 2)  Take the higher dose Metoprolol 100mg  XL in the evening. Can take 2 of your current dose metoprolol until run out adn then can start the new 100mg  tabs.  3)  Please schedule a follow-up appointment in 1 weeks for blood pressure.  Prescriptions: LISINOPRIL-HYDROCHLOROTHIAZIDE 20-12.5 MG TABS (LISINOPRIL-HYDROCHLOROTHIAZIDE) Take 1 tablet by mouth once a day in the AM.  #30 x 1   Entered and Authorized by:   [name] MD   Signed by:   [name] MD on 07/01/2010   Method used:   Electronically to        [facility] [phone]* (retail)       [address] [place], [place]  [address]       Ph: [phone]       Fax: [phone]   RxID:   [id number] TOPROL XL 100 MG XR24H-TAB (METOPROLOL SUCCINATE) Take 1 tablet by mouth once a day  #30 x 1   Entered and Authorized by:   [name] MD    Signed by:   [name] MD on 07/01/2010   Method used:   Electronically to        [facility] [phone]* (retail)       [address] [place], [place]  [address]       Ph: [phone]       Fax:  [phone]   RxID:   [id number] FAMOTIDINE 20 MG TABS (FAMOTIDINE) Take 1 tablet by mouth two times a day  #60 x 3   Entered and Authorized by:   [name] MD   Signed by:   [name] MD on 07/01/2010   Method used:   Electronically to        [facility] [phone]* (retail)       [address] [place], [place]  [address]       Ph: [phone]       Fax: [phone]   RxID:   [id number]

## 2010-12-06 NOTE — Progress Notes (Signed)
Summary: Needs rx for Amitiza  Phone Note Call from Patient Call back at Home Phone [phone]   Caller: Patient Call For: [name] MD Summary of Call: Pt calls and states that the Amitiza twice a day is working for her and would like presription called to her pharmacy for it Initial call taken by: Kathlene November LPN,  September 06, 2010 9:04 AM  Follow-up for Phone Call        Rx Called In Follow-up by: [name] MD,  September 06, 2010 10:29 AM    New/Updated Medications: AMITIZA 24 MCG CAPS (LUBIPROSTONE) Take 1 tablet by mouth two times a day Prescriptions: AMITIZA 24 MCG CAPS (LUBIPROSTONE) Take 1 tablet by mouth two times a day  #60 x 3   Entered and Authorized by:   [name] MD   Signed by:   [name] MD on 09/06/2010   Method used:   Electronically to        [facility] [phone]* (retail)       [address] [place], [place]  [address]       Ph: [phone]       Fax: [phone]   RxID:   [id number]

## 2010-12-06 NOTE — Progress Notes (Signed)
  Phone Note Call from Patient   Caller: Daughter Call For: [name] MD Summary of Call: Does pt need to take her BP pill this am even though she had the BP pill in the office. also the rash that she had on the arms and chest went away after taking the pill in the office Initial call taken by: Avon Gully CMA, Duncan Dull),  June 10, 2010 9:44 AM  Follow-up for Phone Call        Brandy Tran needs to take the atenolol this AM and then tomorrow can start the metoprolol I sent to the pharm.  Follow-up by: [name] MD,  June 10, 2010 11:40 AM  Additional Follow-up for Phone Call Additional follow up Details #1::        pt notified Additional Follow-up by: Avon Gully CMA, Duncan Dull),  June 10, 2010 1:25 PM

## 2010-12-06 NOTE — Progress Notes (Signed)
Summary: manufacturers for the nitrodur patch  ---- Converted from flag ---- ---- 06/10/2010 2:00 PM, Avon Gully CMA, (AAMA) wrote: called pharm and she gets through mail order, [facility] did say the manufactyurere prob did change since that is happening frequently alternatives are Mylan, Alvogan, and [employer]  ---- 06/10/2010 9:26 AM, [name] MD wrote: Call her phamacy adn find out it the [facility] has changed in the last 3 months for her nitrodur patch. If not then is there another brand we can write for. She is getting an allergic skin reaction to it. ------------------------------  Appended Document: manufacturers for the nitrodur patch Call pt adn let her know the above about  the manufacturer chaning. Will try Alvogan. New rx sent. August  5, 20112:57 PM Metheney MD, Santina Evans   06/10/10 pt notified and voiced understanding.acm3:00

## 2010-12-06 NOTE — Consult Note (Signed)
Summary: [facility]  [facility]   Imported By: Lanelle Bal 09/14/2010 12:47:56  _____________________________________________________________________  External Attachment:    Type:   Image     Comment:   External Document

## 2010-12-06 NOTE — Progress Notes (Signed)
Summary: Dexilant samples helped- wants rx  Phone Note Call from Patient Call back at [phone]   Caller: Daughter- Boneta Lucks Call For: [name] MD Summary of Call: Daughter calls and states that the Dexilant samples given to her mother are working and would like script called into her pharmacy Initial call taken by: Kathlene November LPN,  August 16, 2010 8:56 AM  Follow-up for Phone Call        Rx Called In Follow-up by: [name] MD,  August 16, 2010 10:34 AM    New/Updated Medications: DEXILANT 30 MG CPDR (DEXLANSOPRAZOLE) Take 1 tablet by mouth once a day Prescriptions: DEXILANT 30 MG CPDR (DEXLANSOPRAZOLE) Take 1 tablet by mouth once a day  #30 x 1   Entered and Authorized by:   [name] MD   Signed by:   [name] MD on 08/16/2010   Method used:   Electronically to        [facility] [phone]* (retail)       [address] [place], [place]  [address]       Ph: [phone]       Fax: [phone]   RxID:   [id number]

## 2010-12-06 NOTE — Assessment & Plan Note (Signed)
Summary: BP, Rash, UTI   Vital Signs:  Patient profile:   77 year old female Height:      66 inches Weight:      195 pounds BMI:     31.59 Pulse rate:   56 / minute BP sitting:   235 / 101  (left arm) Cuff size:   regular  Vitals Entered By: Avon Gully CMA, (AAMA) (June 10, 2010 8:10 AM)  Serial Vital Signs/Assessments:  Time      Position  BP       Pulse  Resp  Temp     By 8:15 AM             230/110                        Avon Gully CMA, (AAMA) 9:40 AM             190/110                        Avon Gully CMA, (AAMA) 9:40 AM             150/84                         Avon Gully CMA, (AAMA)  CC: frequent urination at night    Primary Care Doniven Vanpatten:  [name] MD  CC:  frequent urination at night .  History of Present Illness: RAsh on upper chest for about 8 weeks. has been seing derm and had a bx that showed allergic reaction. They have been unable to figure out why but last few days saughter has noticed that she get a new or worsening rash near her nitrdur patch. She is not sure if the manufacturer has changed.  She has been itching all over. She was given a steroid shot and topical steroid cream for relief but hasn[t helped much. It is mostly on her chest. and upper arms  She has also had nocturia and frequency and last week had an accident at night. No hematuria, back pain or fever. No worsening or alleviating sxs.    Current Medications (verified): 1)  Levoxyl 100 Mcg Tabs (Levothyroxine Sodium) .... Take 1 Tablet By Mouth Once A Day 2)  Vitamin E 1000 Unit Crea (Vitamin E) .... One Tablet By Mouth Once Daily 3)  Calcium 600/vitamin D 600-400 Mg-Unit Chew (Calcium Carbonate-Vitamin D) .... One Tablet By Mouth Once Daily 4)  Actonel 5 Mg Tabs (Risedronate Sodium) .... Take 1 Tablet By Mouth Once A Day 5)  Vitamin D 54098 Unit Caps (Ergocalciferol) .... One Tab By Mouth Once A Week 6)  Nitro-Dur 0.2 Mg/hr Pt24 (Nitroglycerin) .Marland Kitchen.. 1 Patch  Daily 7)  Vitamin B-12 2500 Mcg Subl (Cyanocobalamin) 8)  3 Wheel Walker. .... Dx. Bilat Leg Pain, Balance Issues, Sob. 9)  Atenolol 100 Mg Tabs (Atenolol) .... Take One Pill Twice A Day  Allergies (verified): 1)  ! Neosporin Af (Miconazole Nitrate) 2)  ! Neomycin-Polymyxin B Gu (Neomycin-Polymyxin B Gu) 3)  ! Augmentin Es-600  Comments:  Nurse/Medical Assistant: The patient's medications and allergies were reviewed with the patient and were updated in the Medication and Allergy Lists. Avon Gully CMA, Duncan Dull) (June 10, 2010 8:13 AM)  Physical Exam  General:  Well-developed,well-nourished,in no acute distress; alert,appropriate and cooperative throughout examination Head:  Normocephalic and atraumatic without obvious abnormalities. No apparent alopecia or balding. Eyes:  No corneal or conjunctival inflammation noted. EOMI. Perrla.  Neck:  No deformities, masses, or tenderness noted. Lungs:  Normal respiratory effort, chest expands symmetrically. Lungs are clear to auscultation, no crackles or wheezes. Heart:  Normal rate and regular rhythm. S1 and S2 normal without gallop, murmur, click, rub or other extra sounds. Skin:  Scattered erythematous papules on her upper chest and upper arms varrying in size from pinpoint to 0.2 cm.  Few scattered on her neck as well.  Cervical Nodes:  No lymphadenopathy noted Psych:  Cognition and judgment appear intact. Alert and cooperative with normal attention span and concentration. No apparent delusions, illusions, hallucinations   Impression & Recommendations:  Problem # 1:  HYPERTENSION, MILD (ICD-401.1) Assessment Deteriorated  Given one dose of clonidine in the office.  BP did response.  She had not taken any of her BP pill today. MAy also be realated to infection and not feeling well and the allergic reaction she is experiences. Says she often doesn't take he 2nd atenolol dose because she feels weak and tired on it.  Discussed changing to  metoprolol XL to get a smoother release of the medication.  Her updated medication list for this problem includes:    Toprol Xl 50 Mg Xr24h-tab (Metoprolol succinate) .Marland Kitchen... Take 1 tablet by mouth once a day  BP today: 235/101 Prior BP: 131/72 (12/27/2009)  Labs Reviewed: K+: 4.6 (10/15/2008) Creat: : 0.76 (10/15/2008)     Problem # 2:  SKIN RASH (ICD-782.1) Assessment: New Discussed likely from her nitro dur pathc. Will call the pharmacy and see if manufacturere or product contents liek the glu on the patch has changed.   Orders: T-Comprehensive Metabolic Panel ([id number]) T-CBC No Diff ([id number])  Problem # 3:  UTI (ICD-599.0) Assessment: New UA is +  Will treat and send for culture.  Her updated medication list for this problem includes:    Ciprofloxacin Hcl 500 Mg Tabs (Ciprofloxacin hcl) .Marland Kitchen... Take 1 tablet by mouth two times a day for 3 days  Orders: UA Dipstick w/Micro (automated) (81001) T-Urine Culture (Spectrum Order) [id number])  Complete Medication List: 1)  Levoxyl 100 Mcg Tabs (Levothyroxine sodium) .... Take 1 tablet by mouth once a day 2)  Vitamin E 1000 Unit Crea (Vitamin e) .... One tablet by mouth once daily 3)  Calcium 600/vitamin D 600-400 Mg-unit Chew (Calcium carbonate-vitamin d) .... One tablet by mouth once daily 4)  Actonel 5 Mg Tabs (Risedronate sodium) .... Take 1 tablet by mouth once a day 5)  Vitamin D 13244 Unit Caps (Ergocalciferol) .... One tab by mouth once a week 6)  Nitro-dur 0.2 Mg/hr Pt24 (Nitroglycerin) .Marland Kitchen.. 1 patch daily 7)  Vitamin B-12 2500 Mcg Subl (Cyanocobalamin) 8)  3 Wheel Walker.  .... Dx. bilat leg pain, balance issues, sob. 9)  Toprol Xl 50 Mg Xr24h-tab (Metoprolol succinate) .... Take 1 tablet by mouth once a day 10)  Ciprofloxacin Hcl 500 Mg Tabs (Ciprofloxacin hcl) .... Take 1 tablet by mouth two times a day for 3 days  Other Orders: T-TSH ([id number])  Patient Instructions: 1)  Stop the nitrodur patch. We will  try to change manufacturers.  2)  Stop your atenol and change to the metoprolol XL.  3)  Complete the antibiotic for your urinary tract infection.  Prescriptions: CIPROFLOXACIN HCL 500 MG TABS (CIPROFLOXACIN HCL) Take 1 tablet by mouth two times a day for 3 days  #6 x 0   Entered and Authorized by:   [name] MD  Signed by:   [name] MD on 06/10/2010   Method used:   Electronically to        [facility] [phone]* (retail)       [address] [place], [place]  [address]       Ph: [phone]       Fax: [phone]   RxID:   [id number] TOPROL XL 50 MG XR24H-TAB (METOPROLOL SUCCINATE) Take 1 tablet by mouth once a day  #30 x 2   Entered and Authorized by:   [name] MD   Signed by:   [name] MD on 06/10/2010   Method used:   Electronically to        [facility] [phone]* (retail)       [address] [place], [place]  [address]       Ph: [phone]       Fax: [phone]   RxID:   [id number]   Laboratory Results   Urine Tests  Date/Time Received: 06/10/10 Date/Time Reported: 06/10/10  Routine Urinalysis   Color: yellow Appearance: Clear Glucose: negative   (Normal Range: Negative) Bilirubin: negative   (Normal Range: Negative) Ketone: negative   (Normal Range: Negative) Spec. Gravity: 1.015   (Normal Range: 1.003-1.035) Blood: small   (Normal Range: Negative) pH: 7.0   (Normal Range: 5.0-8.0) Protein: negative   (Normal Range: Negative) Urobilinogen: 0.2   (Normal Range: 0-1) Nitrite: negative   (Normal Range: Negative) Leukocyte Esterace: small   (Normal Range: Negative)

## 2010-12-06 NOTE — Assessment & Plan Note (Signed)
Summary: 1 week f/u HTN   Vital Signs:  Patient profile:   77 year old female Height:      66 inches Weight:      195 pounds Pulse rate:   94 / minute BP sitting:   190 / 87  (right arm) Cuff size:   regular  Vitals Entered By: Avon Gully CMA, Duncan Dull) (July 07, 2010 9:07 AM) CC: f/u BP   Primary Care Provider:  [name] MD  CC:  f/u BP.  History of Present Illness: Feeling better on teh medication. Now taking morning pill with food and this has helped her stomach. Feels less tire. No SE of the medication.   Current Medications (verified): 1)  Levoxyl 100 Mcg Tabs (Levothyroxine Sodium) .... Take 1 Tablet By Mouth Once A Day 2)  Vitamin E 1000 Unit Crea (Vitamin E) .... One Tablet By Mouth Once Daily 3)  Calcium 600/vitamin D 600-400 Mg-Unit Chew (Calcium Carbonate-Vitamin D) .... One Tablet By Mouth Once Daily 4)  Vitamin D 16109 Unit Caps (Ergocalciferol) .... One Tab By Mouth Once A Week 5)  Nitro-Dur 0.2 Mg/hr Pt24 (Nitroglycerin) .Marland Kitchen.. 1 Patch Daily Alvogan Brand Only*********having Reaction To Current Brand. 6)  Vitamin B-12 2500 Mcg Subl (Cyanocobalamin) 7)  3 Wheel Walker. .... Dx. Bilat Leg Pain, Balance Issues, Sob. 8)  Toprol Xl 100 Mg Xr24h-Tab (Metoprolol Succinate) .... Take 1 Tablet By Mouth Once A Day 9)  Famotidine 20 Mg Tabs (Famotidine) .... Take 1 Tablet By Mouth Two Times A Day 10)  Lisinopril-Hydrochlorothiazide 20-12.5 Mg Tabs (Lisinopril-Hydrochlorothiazide) .... Take 1 Tablet By Mouth Once A Day in The Am.  Allergies (verified): 1)  ! Neosporin Af (Miconazole Nitrate) 2)  ! Neomycin-Polymyxin B Gu (Neomycin-Polymyxin B Gu) 3)  ! Augmentin Es-600  Comments:  Nurse/Medical Assistant: The patient's medications and allergies were reviewed with the patient and were updated in the Medication and Allergy Lists. Avon Gully CMA, Duncan Dull) (July 07, 2010 9:08 AM)  Physical Exam  General:  Well-developed,well-nourished,in no  acute distress; alert,appropriate and cooperative throughout examination Lungs:  Normal respiratory effort, chest expands symmetrically. Lungs are clear to auscultation, no crackles or wheezes. Heart:  Normal rate and regular rhythm. S1 and S2 normal without gallop, murmur, click, rub or other extra sounds.   Impression & Recommendations:  Problem # 1:  HYPERTENSION, MILD (ICD-401.1) Discussed increasing hr doses on both medications as hr BP is improved but still very high.  I wil be happy to get her under 150 adn this should reduce her risk of stroke. Go for labwork next week since on a diuretic.  F/U in 1 month.  Her updated medication list for this problem includes:    Toprol Xl 200 Mg Xr24h-tab (Metoprolol succinate) .Marland Kitchen... Take 1 tablet by mouth once a day    Lisinopril-hydrochlorothiazide 20-12.5 Mg Tabs (Lisinopril-hydrochlorothiazide) .Marland Kitchen... Take 2  tablet by mouth once a day in the am.  Orders: T-Basic Metabolic Panel ([id number])  Complete Medication List: 1)  Levoxyl 100 Mcg Tabs (Levothyroxine sodium) .... Take 1 tablet by mouth once a day 2)  Vitamin E 1000 Unit Crea (Vitamin e) .... One tablet by mouth once daily 3)  Calcium 600/vitamin D 600-400 Mg-unit Chew (Calcium carbonate-vitamin d) .... One tablet by mouth once daily 4)  Vitamin D 91478 Unit Caps (Ergocalciferol) .... One tab by mouth once a week 5)  Nitro-dur 0.2 Mg/hr Pt24 (Nitroglycerin) .Marland Kitchen.. 1 patch daily alvogan brand only*********having reaction to current brand. 6)  Vitamin B-12 2500 Mcg Subl (Cyanocobalamin) 7)  3 Wheel Walker.  .... Dx. bilat leg pain, balance issues, sob. 8)  Toprol Xl 200 Mg Xr24h-tab (Metoprolol succinate) .... Take 1 tablet by mouth once a day 9)  Famotidine 20 Mg Tabs (Famotidine) .... Take 1 tablet by mouth two times a day 10)  Lisinopril-hydrochlorothiazide 20-12.5 Mg Tabs (Lisinopril-hydrochlorothiazide) .... Take 2  tablet by mouth once a day in the am.  Other Orders: T-Dual DXA  Bone Density/ Axial (65784)  Patient Instructions: 1)  Increase the Toprol XL 100mg   to 2 tabs at bedtime  2)  Increase the lisinopril to 2 tabs in the morning.  3)  Please schedule a follow-up appointment in 1 month.  4)  Fo for lab work in one week.

## 2010-12-06 NOTE — Progress Notes (Signed)
Summary: Bowels  Phone Note Call from Patient Call back at Home Phone [phone]   Caller: Patient Call For: [name] MD Summary of Call: Seen in ED last night for impaction and wanted to know if you had any suggestions for her to help keep her bowels moving Initial call taken by: Kathlene November LPN,  September 02, 2010 9:06 AM  Follow-up for Phone Call        Can start Senokt which is  a stimulant once a day and see if helps. Let me know ifnot working by Monday.  Follow-up by: [name] MD,  September 02, 2010 9:18 AM  Additional Follow-up for Phone Call Additional follow up Details #1::        Pt notified of MD instructions Additional Follow-up by: Kathlene November LPN,  September 02, 2010 9:51 AM

## 2010-12-08 NOTE — Assessment & Plan Note (Signed)
Summary: skin rash, hypertension   Vital Signs:  Patient profile:   77 year old female Height:      66 inches Weight:      191 pounds Pulse rate:   84 / minute BP sitting:   218 / 84  (right arm) Cuff size:   regular  Vitals Entered By: Avon Gully CMA, (AAMA) (November 28, 2010 1:21 PM)  Serial Vital Signs/Assessments:  Time      Position  BP       Pulse  Resp  Temp     By 1:55 PM             191/86   58                    Andrea McCrimmon CMA, (AAMA)  CC: f/u rash ,much improved   Primary Care Provider:  [name] MD  CC:  f/u rash  and much improved.  History of Present Illness: Her rash has resolved.  overall she is doing very well today.  The skin is no longer itching has completely cleared.  She did say she took her blood pressure medications today but when she walks in a parking lot into the office she notices her heart beats more quickly and she felt a little short of breath.  This is not new for her has been going on for a long time.  She is able to still do her housework which is most important to her.  She did go up on the dose on her blood pressure medication she says she is tolerating it just fine.  She says at home she gets normal blood pressures in the 130s.  Current Medications (verified): 1)  Levoxyl 100 Mcg Tabs (Levothyroxine Sodium) .... Take 1 Tablet By Mouth Once A Day 2)  Vitamin E 1000 Unit Crea (Vitamin E) .... One Tablet By Mouth Once Daily 3)  Calcium 600/vitamin D 600-400 Mg-Unit Chew (Calcium Carbonate-Vitamin D) .... One Tablet By Mouth Once Daily 4)  Vitamin D 16109 Unit Caps (Ergocalciferol) .... One Tab By Mouth Once A Week 5)  Nitro-Dur 0.2 Mg/hr Pt24 (Nitroglycerin) .Marland Kitchen.. 1 Patch Daily Alvogan Brand Only*********having Reaction To Current Brand. 6)  Toprol Xl 200 Mg Xr24h-Tab (Metoprolol Succinate) .... Take 1 Tablet By Mouth Once A Day 7)  Losartan Potassium 50 Mg Tabs (Losartan Potassium) .... Take 1 Tablet By Mouth Once A Day 8)   Amitiza 24 Mcg Caps (Lubiprostone) .... Take 1 Tablet By Mouth Two Times A Day 9)  Prednisone 20 Mg Tabs (Prednisone) .... 2 Tabs By Mouth Daily For One Week, Then Once A Day For About 1 Week. 10)  Cephalexin 500 Mg Caps (Cephalexin) .... Take 1 Tablet By Mouth Two Times A Day For 10 Days.  Allergies (verified): 1)  ! Neosporin Af (Miconazole Nitrate) 2)  ! Neomycin-Polymyxin B Gu (Neomycin-Polymyxin B Gu) 3)  ! Augmentin Es-600  Comments:  Nurse/Medical Assistant: The patient's medications and allergies were reviewed with the patient and were updated in the Medication and Allergy Lists. Avon Gully CMA, Duncan Dull) (November 28, 2010 1:22 PM)  Past History:  Social History: Last updated: 12/27/2009 Daughter is very involved in her care. Daughter works at [employer]. Never Smoked  Physical Exam  General:  Well-developed,well-nourished,in no acute distress; alert,appropriate and cooperative throughout examination Lungs:  Normal respiratory effort, chest expands symmetrically. Lungs are clear to auscultation, no crackles or wheezes. Heart:  Normal rate and regular rhythm. S1 and S2 normal  without gallop, murmur, click, rub or other extra sounds. Skin:  her legs look great today.  She does have a little bit of dry skin on her anterior tibia on the right.  No breaks in the scan and a rash. Psych:  Cognition and judgment appear intact. Alert and cooperative with normal attention span and concentration. No apparent delusions, illusions, hallucinations   Impression & Recommendations:  Problem # 1:  SKIN RASH (ICD-782.1) Has resolved.   Problem # 2:  HYPERTENSION, MILD (ICD-401.1) Dong well on teh new losartan.I would consider increasing her dose but she says her home blood pressure sore grade.  On recheck today here in the office it did come down.  I do think she gets an abnormal blood pressure reaction with exertion.  She is not interested in seeing cardiology at this time. Her updated  medication list for this problem includes:    Toprol Xl 200 Mg Xr24h-tab (Metoprolol succinate) .Marland Kitchen... Take 1 tablet by mouth once a day    Losartan Potassium 50 Mg Tabs (Losartan potassium) .Marland Kitchen... Take 1 tablet by mouth once a day  Complete Medication List: 1)  Levoxyl 100 Mcg Tabs (Levothyroxine sodium) .... Take 1 tablet by mouth once a day 2)  Vitamin E 1000 Unit Crea (Vitamin e) .... One tablet by mouth once daily 3)  Calcium 600/vitamin D 600-400 Mg-unit Chew (Calcium carbonate-vitamin d) .... One tablet by mouth once daily 4)  Vitamin D 24401 Unit Caps (Ergocalciferol) .... One tab by mouth once a week 5)  Nitro-dur 0.2 Mg/hr Pt24 (Nitroglycerin) .Marland Kitchen.. 1 patch daily alvogan brand only*********having reaction to current brand. 6)  Toprol Xl 200 Mg Xr24h-tab (Metoprolol succinate) .... Take 1 tablet by mouth once a day 7)  Losartan Potassium 50 Mg Tabs (Losartan potassium) .... Take 1 tablet by mouth once a day 8)  Amitiza 24 Mcg Caps (Lubiprostone) .... Take 1 tablet by mouth two times a day 9)  Prednisone 20 Mg Tabs (Prednisone) .... 2 tabs by mouth daily for one week, then once a day for about 1 week. 10)  Cephalexin 500 Mg Caps (Cephalexin) .... Take 1 tablet by mouth two times a day for 10 days.   Orders Added: 1)  Est. Patient Level III [02725]

## 2010-12-08 NOTE — Assessment & Plan Note (Signed)
Summary: Rash, HTN   Vital Signs:  Patient profile:   77 year old female Height:      66 inches Weight:      189.75 pounds BMI:     30.74 O2 Sat:      93 % on Room air Temp:     98.3 degrees F oral Pulse rate:   88 / minute Pulse rhythm:   regular Resp:     20 per minute BP sitting:   181 / 100  (right arm) Cuff size:   regular  Vitals Entered By: Mervin Kung CMA Duncan Dull) (November 15, 2010 2:55 PM)  O2 Flow:  Room air CC: Pt states she has had an itchy, burning rash x 2 days. Is Patient Diabetic? No Comments Pt unclear on medications but states she is taking Levoxyl, Toprol and Losartan Potassium. Nicki Guadalajara Fergerson CMA Duncan Dull)  November 15, 2010 3:04 PM    Primary Care Provider:  [name] MD  CC:  Pt states she has had an itchy and burning rash x 2 days.Marland Kitchen  History of Present Illness: Pt states she has had an itchy, burning rash x 2 days. Just started last nigt. Put amlactin lotion on it. Says did wear some fleeced lined stocking last Wednesday but otherwise nothing different than usual. No new lotions, creams, cleaners, etc. No known expsoure to poison ivey, etc. She also has some ithcing around her left eye and on her back but not sure if she has a rash there.  Says her heart is racing and she is nervous today. Also her scalp is ithcy today.    Allergies: 1)  ! Neosporin Af (Miconazole Nitrate) 2)  ! Neomycin-Polymyxin B Gu (Neomycin-Polymyxin B Gu) 3)  ! Augmentin Es-600  Physical Exam  General:  Well-developed,well-nourished,in no acute distress; alert,appropriate and cooperative throughout examination Head:  Normocephalic and atraumatic without obvious abnormalities. No apparent alopecia or balding. Skin:  On the shin on her right leg she has an erythematou maculopapular rash from the top of her foot to her knee. Has a similar are on the insdie of the right ankle.  Also similar area near to the out left eye.  No rash on her back.    Impression &  Recommendations:  Problem # 1:  SKIN RASH (ICD-782.1) Unclear etiology. It looks allergic but will treat wtih steroid adn antibiotic as she does have a break in teh skin on her right foot near the top.  F./U in one week to make sure resolving.  Will check CBC and labs to rule out other causes. This is the second time this has happened.   BP today: 181/100 Prior BP: 187/93 (08/30/2010)  Labs Reviewed: K+: 4.2 (08/11/2010) Creat: : 0.93 (08/11/2010)     Problem # 2:  HYPERTENSION, MILD (ICD-401.1) Not well controlled. Will inc her losartan. SHe is very hesitant but says she will do it  Her updated medication list for this problem includes:    Toprol Xl 200 Mg Xr24h-tab (Metoprolol succinate) .Marland Kitchen... Take 1 tablet by mouth once a day    Losartan Potassium 50 Mg Tabs (Losartan potassium) .Marland Kitchen... Take 1 tablet by mouth once a day  Complete Medication List: 1)  Levoxyl 100 Mcg Tabs (Levothyroxine sodium) .... Take 1 tablet by mouth once a day 2)  Vitamin E 1000 Unit Crea (Vitamin e) .... One tablet by mouth once daily 3)  Calcium 600/vitamin D 600-400 Mg-unit Chew (Calcium carbonate-vitamin d) .... One tablet by mouth once daily 4)  Vitamin D 04540 Unit Caps (Ergocalciferol) .... One tab by mouth once a week 5)  Nitro-dur 0.2 Mg/hr Pt24 (Nitroglycerin) .Marland Kitchen.. 1 patch daily alvogan brand only*********having reaction to current brand. 6)  Toprol Xl 200 Mg Xr24h-tab (Metoprolol succinate) .... Take 1 tablet by mouth once a day 7)  Losartan Potassium 50 Mg Tabs (Losartan potassium) .... Take 1 tablet by mouth once a day 8)  Amitiza 24 Mcg Caps (Lubiprostone) .... Take 1 tablet by mouth two times a day 9)  Prednisone 20 Mg Tabs (Prednisone) .... 2 tabs by mouth daily for one week, then once a day for about 1 week. 10)  Cephalexin 500 Mg Caps (Cephalexin) .... Take 1 tablet by mouth two times a day for 10 days.  Other Orders: T-CBC w/Diff ([id number]) T-Comprehensive Metabolic Panel  ([id number]) T-TSH [phone])  Patient Instructions: 1)  Please schedule a follow-up appointment in 1 weeks and make sure the rash is resolved.  Prescriptions: CEPHALEXIN 500 MG CAPS (CEPHALEXIN) Take 1 tablet by mouth two times a day for 10 days.  #20 x 0   Entered and Authorized by:   [name] MD   Signed by:   [name] MD on 11/15/2010   Method used:   Electronically to        [facility] [phone]* (retail)       [address] [place], [place]  [address]       Ph: [phone]       Fax: [phone]   RxID:   [id number] PREDNISONE 20 MG TABS (PREDNISONE) 2 tabs by mouth daily for one week, then once a day for about 1 week.  #21 x 0   Entered and Authorized by:   [name] MD   Signed by:   [name] MD on 11/15/2010   Method used:   Electronically to        [facility] [phone]* (retail)       [address] [place], [place]  [address]       Ph: [phone]       Fax: [phone]   RxID:   [id number] LOSARTAN POTASSIUM 50 MG TABS (LOSARTAN POTASSIUM) Take 1 tablet by mouth once a day  #30 x 1   Entered and Authorized by:   [name] MD   Signed by:   [name] MD on 11/15/2010   Method used:   Electronically to        [facility] [phone]* (retail)       [address] [place], [place]  [address]       Ph: [phone]       Fax: [phone]   RxID:   [id number]    Orders Added: 1)  T-CBC w/Diff [[id number]] 2)  T-Comprehensive Metabolic Panel [[id number]] 3)  T-TSH [[id number]] 4)  Est. Patient Level IV [15400]    Current Allergies (reviewed today): ! NEOSPORIN AF (MICONAZOLE NITRATE) ! NEOMYCIN-POLYMYXIN B GU (NEOMYCIN-POLYMYXIN B GU) ! AUGMENTIN ES-600

## 2011-02-08 ENCOUNTER — Encounter: Payer: Self-pay | Admitting: Family Medicine

## 2011-02-09 ENCOUNTER — Telehealth: Payer: Self-pay | Admitting: Family Medicine

## 2011-02-09 ENCOUNTER — Ambulatory Visit
Admission: RE | Admit: 2011-02-09 | Discharge: 2011-02-09 | Disposition: A | Discharge status: Home / Self Care | Payer: Medicare Other | Source: Ambulatory Visit | Attending: Family Medicine | Admitting: Family Medicine

## 2011-02-09 ENCOUNTER — Encounter: Payer: Self-pay | Admitting: Family Medicine

## 2011-02-09 ENCOUNTER — Ambulatory Visit (INDEPENDENT_AMBULATORY_CARE_PROVIDER_SITE_OTHER): Payer: Medicare Other | Admitting: Family Medicine

## 2011-02-09 VITALS — BP 198/91 | HR 60 | Ht 66.0 in | Wt 193.0 lb

## 2011-02-09 DIAGNOSIS — M545 Low back pain: Secondary | ICD-10-CM

## 2011-02-09 DIAGNOSIS — I10 Essential (primary) hypertension: Secondary | ICD-10-CM

## 2011-02-09 MED ORDER — LOSARTAN POTASSIUM 100 MG PO TABS: 100.0000 mg | ORAL_TABLET | Freq: Every day | ORAL | Status: DC

## 2011-02-09 NOTE — Patient Instructions (Addendum)
We will call you with the xray results I am increasing your losartan to 100mg  daily  Follow up in 2 weeks for blood pressure.  Can use heat  On the area and use tylenol or tramadol for pain.

## 2011-02-09 NOTE — Telephone Encounter (Signed)
Call pt: Did have some arthrits and some loss of the disc space height of L1-2.  No sign of compression fracture. Recommend tramadol and physical therapy on her back. Let me know if OK with scheduling PT.

## 2011-02-09 NOTE — Telephone Encounter (Signed)
Pt notified and does not want to do PT right now

## 2011-02-09 NOTE — Assessment & Plan Note (Signed)
Lumbago bsed on exam. Reassured her I don't think this is her hip.  Because of her age will get an xray to rule out compression fracture. She can use Tyelnol or tramadol for her pain. Consider PT if xrays are negative.

## 2011-02-09 NOTE — Assessment & Plan Note (Signed)
Still very elevated. Will inc losartan to 100mg . She says she occ feels dizzy and thinks it is from her BP being too high. F/U in 2 weeks.

## 2011-02-09 NOTE — Progress Notes (Signed)
  Subjective:    Patient ID: Brandy Tran, female    DOB: 16-Jun-1922, 77 y.o.   MRN: [id number]  HPI Left left back pain for 1 month. No trauma. Feels sore. Daily, chronic.  Pain is 10/10. No hematuria or frequency or dysuria. No fever or chills.  When gets up for sitting it is more painful.  Sitting improves her pain.  No meds for her pain.  Hx of hip replacement on the left. No pain radiating into her leg. Dose have tramadol at home that she has used for back an hip pain before but has not used any. She is most concerned about her hip replacement.   Review of Systems  BP 198/91  Pulse 60  Ht 5\' 6"  (1.676 m)  Wt 193 lb (87.544 kg)  BMI 31.15 kg/m2    Allergies  Allergen Reactions  . WUJ:[id number]+AOZHYQMVH+QIONGEXBMW Acid+Aspartame     REACTION: Vomit  . Neomycin-Polymyxin B Gu     REACTION: Rash    Past Medical History  Diagnosis Date  . Hiatal hernia     Past Surgical History  Procedure Date  . Left hip replacement s/p fracture   . Abdominal hysterectomy 1968    partial    History   Social History  . Marital Status: Widowed    Spouse Name: N/A    Number of Children: N/A  . Years of Education: N/A   Occupational History  . Not on file.   Social History Main Topics  . Smoking status: Never Smoker   . Smokeless tobacco: Not on file  . Alcohol Use:   . Drug Use:   . Sexually Active:    Other Topics Concern  . Not on file   Social History Narrative  . No narrative on file      Objective:   Physical Exam  Constitutional: She appears well-developed and well-nourished.       Obese.   Musculoskeletal: She exhibits no edema.       Lumbar back: She exhibits tenderness. She exhibits normal range of motion, no bony tenderness, no swelling, no edema and no deformity.       Arms:      Hip, knee and ankle strength 5/5 bilat.  She is stiff and did need assistance getting up on the exam table.  No pain over the greater trochanteric bursa or in the groin crease.             Assessment & Plan:

## 2011-02-13 ENCOUNTER — Other Ambulatory Visit: Payer: Self-pay | Admitting: Family Medicine

## 2011-03-07 ENCOUNTER — Other Ambulatory Visit: Payer: Self-pay | Admitting: Family Medicine

## 2011-03-07 NOTE — Telephone Encounter (Signed)
Pt called and needs refill for muscle relaxer.  Generic Flexeril 5 mg PO PRN needed.  Had old script from 2010 with 2 pills left and pt stated she took 1 per day last 2 days and has no more.  Took for LBP and was seen by [facility] two weeks ago for this and had xray.  Flexeril helping significantly.  Pt uses [facility].  Please advise.  Jarvis Newcomer, LPN [facility]

## 2011-03-08 MED ORDER — CYCLOBENZAPRINE HCL 5 MG PO TABS: 5.0000 mg | ORAL_TABLET | Freq: Every day | ORAL | Status: AC | PRN

## 2011-03-08 NOTE — Telephone Encounter (Signed)
Will refill.

## 2011-03-08 NOTE — Telephone Encounter (Signed)
Pt notified that her Flexeril 5mg  Rx Refill was sent to the pharmacy, Jarvis Newcomer, LPN [facility]

## 2011-03-21 ENCOUNTER — Telehealth: Payer: Self-pay | Admitting: Family Medicine

## 2011-03-21 ENCOUNTER — Ambulatory Visit (INDEPENDENT_AMBULATORY_CARE_PROVIDER_SITE_OTHER): Payer: Medicare Other | Admitting: Family Medicine

## 2011-03-21 ENCOUNTER — Encounter: Payer: Self-pay | Admitting: Family Medicine

## 2011-03-21 ENCOUNTER — Telehealth: Payer: Self-pay | Admitting: *Deleted

## 2011-03-21 VITALS — BP 198/85 | HR 88 | Ht 66.0 in | Wt 195.0 lb

## 2011-03-21 DIAGNOSIS — I10 Essential (primary) hypertension: Secondary | ICD-10-CM

## 2011-03-21 DIAGNOSIS — J069 Acute upper respiratory infection, unspecified: Secondary | ICD-10-CM

## 2011-03-21 DIAGNOSIS — M549 Dorsalgia, unspecified: Secondary | ICD-10-CM

## 2011-03-21 DIAGNOSIS — M545 Low back pain, unspecified: Secondary | ICD-10-CM

## 2011-03-21 MED ORDER — AZITHROMYCIN 250 MG PO TABS: ORAL_TABLET | ORAL | Status: AC

## 2011-03-21 MED ORDER — AMLODIPINE BESYLATE 5 MG PO TABS: 5.0000 mg | ORAL_TABLET | Freq: Every day | ORAL | Status: DC

## 2011-03-21 NOTE — Telephone Encounter (Signed)
[facility] doesn't do arthograms but they do them in [place] location so we can send order there

## 2011-03-21 NOTE — Progress Notes (Signed)
  Subjective:    Patient ID: Brandy Tran, female    DOB: [birth year], 77 y.o.   MRN: [id number]  HPI ST and dizziness for 3-4 days.  Felt like she may have had a fever, but did not check it. Some ear discomfort. No cough. Some post nasal drip. No meds for it. Her daughter was sick with a similar illness and she ended up needing antibiotics to completely get over and get back to work.  She says she is taking her BP meds regularly and did increase the dose on her losartan to 100 mg at last office visit about 2-3 weeks ago.   She is still having significant back pain. She wonders why she is not getting better. She says the tramadol does help with her pain but then it just comes right back. She feels like she could not do physical therapy as she could not get a ride here to 3 times a week. She typically relies on her daughter he does work full time.  Review of Systems     Objective:   Physical Exam  Constitutional: She appears well-developed and well-nourished.  HENT:  Head: Normocephalic and atraumatic.  Right Ear: External ear normal.  Left Ear: External ear normal.  Nose: Nose normal.  Eyes: Conjunctivae are normal. Pupils are equal, round, and reactive to light.  Neck: Neck supple. No thyromegaly present.  Cardiovascular: Normal rate and normal heart sounds.   Pulmonary/Chest: Effort normal and breath sounds normal.  Musculoskeletal: She exhibits edema.       1+ bilateral pedal edema. She is only able to lift her left knee to about 90 at the hip.  Lymphadenopathy:    She has no cervical adenopathy.  Skin: Skin is warm and dry.  Psychiatric: She has a normal mood and affect.          Assessment & Plan:  URI-I do think this is likely viral. I did have her prescription for antibiotics if she suddenly feels worse when she is just not making any progress by the end of the week. She agrees and says she will wait to take antibiotic. Recommend staying hydrated and avoid any  anti-inflammatory medications that may increase her blood pressure. The dizziness might be related to the cold symptoms or it could be related to her high blood pressure.

## 2011-03-21 NOTE — Assessment & Plan Note (Signed)
She is still not well controlled but her repeat blood pressure was better. We will add amlodipine to her regimen. I do think she would benefit from a diuretic and she also has lower extremity edema but she says she already has to go to bathroom frequently and often doesn't make it and doesn't want to take a medication that would potentially make that worse. We will recheck her blood pressure in 2-3 weeks. I will have to monitor for worsening  Lower extremity edema as this can often happen with left leg pain. Thus I am starting her on a low dose 5 mg.

## 2011-03-21 NOTE — Assessment & Plan Note (Signed)
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 At this point in time I really think physical therapy would be her best option but clearly indicating here would be difficult for her she is primarily homebound. One is we can consider home physical therapy daily typically is very superficial. There are weak he gets a little bit more time and if she's not improving consider MRI of her lumbar spine. She is also concerned this could be coming from her left hip. She has a history of a left hip replacement and had noted around the time her back pain started but she was unable to lift her left leg past 90 which was a new finding. Today we will get an x-ray of the left hip to make sure that her hardware is intact.

## 2011-03-21 NOTE — Telephone Encounter (Signed)
Will send new ordre. Just want an xray.

## 2011-03-21 NOTE — Telephone Encounter (Signed)
Pts son called and wants a call back from the nurse regarding the patient.  Pt notified but the son was no available to talk to so I spoke with the patient herself.  Pt wants to know if she is to continue her previous BP meds along with the new med amlodipine that was started today.   Plan:  Pt instructed to continue old BP meds and start the new med amlodipine 5 mg PO daily.  Pt voiced understanding. Jarvis Newcomer, LPN [facility]

## 2011-03-22 ENCOUNTER — Ambulatory Visit: Payer: Medicare Other

## 2011-03-22 ENCOUNTER — Ambulatory Visit
Admission: RE | Admit: 2011-03-22 | Discharge: 2011-03-22 | Disposition: A | Discharge status: Home / Self Care | Payer: Medicare Other | Source: Ambulatory Visit | Attending: Family Medicine | Admitting: Family Medicine

## 2011-03-22 DIAGNOSIS — M549 Dorsalgia, unspecified: Secondary | ICD-10-CM

## 2011-03-29 ENCOUNTER — Telehealth: Payer: Self-pay | Admitting: Family Medicine

## 2011-03-29 NOTE — Telephone Encounter (Signed)
Pt called and wants hip xray results done last Thursday. Plan:  Notified pt hip xray with no acute findings. Jarvis Newcomer, LPN [facility]

## 2011-04-13 ENCOUNTER — Other Ambulatory Visit: Payer: Self-pay | Admitting: Family Medicine

## 2011-04-15 ENCOUNTER — Other Ambulatory Visit: Payer: Self-pay | Admitting: Family Medicine

## 2011-05-29 ENCOUNTER — Ambulatory Visit (INDEPENDENT_AMBULATORY_CARE_PROVIDER_SITE_OTHER): Payer: Medicare Other | Admitting: Family Medicine

## 2011-05-29 ENCOUNTER — Telehealth: Payer: Self-pay | Admitting: Family Medicine

## 2011-05-29 ENCOUNTER — Encounter: Payer: Self-pay | Admitting: Family Medicine

## 2011-05-29 VITALS — BP 200/86 | HR 77 | Temp 98.1°F | Ht 66.0 in | Wt 200.0 lb

## 2011-05-29 DIAGNOSIS — B029 Zoster without complications: Secondary | ICD-10-CM

## 2011-05-29 MED ORDER — FAMCICLOVIR 500 MG PO TABS: 500.0000 mg | ORAL_TABLET | Freq: Three times a day (TID) | ORAL | Status: AC

## 2011-05-29 MED ORDER — TRAMADOL HCL 50 MG PO TABS: 50.0000 mg | ORAL_TABLET | Freq: Four times a day (QID) | ORAL | Status: DC | PRN

## 2011-05-29 MED ORDER — LIDOCAINE HCL 2 % EX GEL: CUTANEOUS | Status: DC

## 2011-05-29 MED ORDER — OLMESARTAN MEDOXOMIL 20 MG PO TABS: 20.0000 mg | ORAL_TABLET | Freq: Every day | ORAL | Status: DC

## 2011-05-29 NOTE — Telephone Encounter (Signed)
Call pt: OK to use cream.

## 2011-05-29 NOTE — Telephone Encounter (Signed)
Pt called and said Dr. Linford Arnold wanted her to call the name of the cream she has used for rash.  She has used priamcinolone cream. Jarvis Newcomer, LPN [facility]

## 2011-05-29 NOTE — Progress Notes (Signed)
  Subjective:    Patient ID: Brandy Tran, female    DOB: 1922-09-21, 77 y.o.   MRN: [id number]  HPI She broke out with rash on her right shoulder yesetrday. It is itchy or painful.  Using a prescription cream on it, that is helping but she doesn't  Know the name of it. This is helping.  Never had shingles before. Never had the vaccine for shingles. . To painful to wear a bra. No worsening sxs. Says she feels fatigued and achey all over. No fever.    Review of Systems     Objective:   Physical Exam  Constitutional: She appears well-developed and well-nourished.  Skin:       Right shoulder and right anterior chest with 1-2 cm erythematous papules. One is scabbed. No true vesicles.  Tender. No active drianage.           Assessment & Plan:  Shingles based on exam.  Likely based on her age. Discussed hygiene since 2 of her children live with her. Also will start her on an antiviral immediately and given lidocaine jelly to use and tramadol for pain. Pt to call if needs additional pain control. I do recommend her son and daughter who are over the age of 26 to get their shingles vaccines.   BP likely elevated from pain today.  Her BP is usually high at the Doctors office.

## 2011-05-29 NOTE — Telephone Encounter (Signed)
Pt contacted and told okay to use the cream.  Pt now asking for higher mg on the new med for BP she is on (norvasc 5 mg).  Pt is taking the metoprolol, but has not taken the coZarr 100 mg in 1 mth.  Pt said the cozarr was making her legs swell.  Please advise. Jarvis Newcomer, LPN [facility]

## 2011-05-29 NOTE — Telephone Encounter (Signed)
Pt informed of the instructions and told she should have a new script to take along with the norvasc 5 mg and the metoprolol. Jarvis Newcomer, LPN [facility]

## 2011-05-29 NOTE — Telephone Encounter (Signed)
Higher dose of norvasc can make her legs swell as well. I will call in new med to take with her metoprolol and her norvasc. Recheck BP in one month.

## 2011-06-09 ENCOUNTER — Telehealth: Payer: Self-pay | Admitting: Family Medicine

## 2011-06-09 NOTE — Telephone Encounter (Signed)
Pt called and is inquiring about the BP regimen.  She went to the pharm and they had a BP med she had never heard of filled for her and she told them she had never been on that before(Benicar).  Pt also has not been taking the losartan although back in April 2012 it looks like you increased.  Pt however, has been taking the amlodipine and the metoprolol medications.  Please advise so I can instruct the pt on what they are to take. Routed to Dr. Marlyne Beards, LPN [facility]'

## 2011-06-09 NOTE — Telephone Encounter (Signed)
Then can start either the losartan or teh Benicar (since already picked it Up) in addition to her other meds for BP.  She needs all 3. Then f/u for BP in 2-3 weeks.

## 2011-06-09 NOTE — Telephone Encounter (Signed)
Pt called to discuss her BP regimen.  Pt understands that she is to take 3 BP meds:  Amlodipine, metoprolol, and either benicar or losartan, and follow up in 2 -3 weeks in the office for an appt. Jarvis Newcomer, LPN [facility]

## 2011-06-09 NOTE — Telephone Encounter (Signed)
I am not really sure about this. It was prescribed on July 23 but I look back in the office note for the new medications is not listed. It is possible the scout sent on the wrong patient which is unfortunate. I definitely don't want her to start this since she is already taking the losartan.  Please tell her that I am sorry and I am not sure what happened. I removed the Benicar from her med list.

## 2011-06-09 NOTE — Telephone Encounter (Signed)
Dr. Linford Arnold just to make sure we are on the same page.  Pt has not been taking the losartan medication.  She has been taking the amlodipine and metoprolol.   Jarvis Newcomer, LPN [facility]

## 2011-06-11 ENCOUNTER — Other Ambulatory Visit: Payer: Self-pay | Admitting: Family Medicine

## 2011-06-15 ENCOUNTER — Other Ambulatory Visit: Payer: Self-pay | Admitting: Family Medicine

## 2011-06-21 ENCOUNTER — Encounter: Payer: Self-pay | Admitting: Family Medicine

## 2011-06-30 ENCOUNTER — Encounter: Payer: Self-pay | Admitting: Family Medicine

## 2011-06-30 ENCOUNTER — Ambulatory Visit (INDEPENDENT_AMBULATORY_CARE_PROVIDER_SITE_OTHER): Payer: Medicare Other | Admitting: Family Medicine

## 2011-06-30 DIAGNOSIS — I10 Essential (primary) hypertension: Secondary | ICD-10-CM

## 2011-06-30 DIAGNOSIS — H6120 Impacted cerumen, unspecified ear: Secondary | ICD-10-CM

## 2011-06-30 DIAGNOSIS — H9209 Otalgia, unspecified ear: Secondary | ICD-10-CM

## 2011-06-30 NOTE — Progress Notes (Signed)
  Subjective:    Patient ID: Brandy Tran, female    DOB: 12/10/1921, 77 y.o.   MRN: [id number]  Hypertension This is a chronic problem. The current episode started more than 1 year ago. The problem has been resolved since onset. The problem is controlled. Pertinent negatives include no blurred vision or chest pain. There are no associated agents to hypertension. Risk factors for coronary artery disease include no known risk factors. Past treatments include calcium channel blockers and beta blockers. There are no compliance problems.   Stopped the losartan since felt too dizzy on it.   Also Rt era pain and RT throat soreness for about 5 days. No fever, sinus congestion, or cough.  No ear drainage. No chane in hearing just feels sore.     Review of Systems  Eyes: Negative for blurred vision.  Cardiovascular: Negative for chest pain.       Objective:   Physical Exam  Constitutional: She is oriented to person, place, and time. She appears well-developed and well-nourished.  HENT:  Head: Normocephalic and atraumatic.  Right Ear: External ear normal.  Left Ear: External ear normal.  Nose: Nose normal.  Mouth/Throat: Oropharynx is clear and moist.       Canals are blocked by cerumen bilat.  Irrigated the RT ear.   Eyes: Conjunctivae and EOM are normal. Pupils are equal, round, and reactive to light.  Neck: Neck supple. No thyromegaly present.  Cardiovascular: Normal rate, regular rhythm and normal heart sounds.   Pulmonary/Chest: Effort normal and breath sounds normal. She has no wheezes.  Lymphadenopathy:    She has no cervical adenopathy.  Neurological: She is alert and oriented to person, place, and time.  Skin: Skin is warm and dry.  Psychiatric: She has a normal mood and affect.          Assessment & Plan:  Otalgia - Cerumen impaction. Irrigation was attempted but unsuccessful to manual disimpaction.  TM was clear. Normal canal. Likely viral ST. Call if nto better in one  week or ear pain persists or if gets worse.   Wanst to come back for flu shot next week.

## 2011-06-30 NOTE — Assessment & Plan Note (Signed)
Removed losartn from her med list since made her feel dizzy. Overall her BP is much better today. Reminded her to keep watching to salt in her diet as this will help. Recheck pressure in 4 months.

## 2011-08-10 ENCOUNTER — Other Ambulatory Visit: Payer: Self-pay | Admitting: Family Medicine

## 2011-08-16 ENCOUNTER — Telehealth: Payer: Self-pay | Admitting: Family Medicine

## 2011-08-16 NOTE — Telephone Encounter (Signed)
Pt's son called stating his mother accidentally sprayed Febreeze last night and got it on her hands and then touched her face and her face swelled.  Pt has not taken anything for an allergic reaction. Plan:  Son instructed to take pt to the UC if pt having trouble with swelling or SOB still.  Pt's son voiced understanding. Jarvis Newcomer, LPN [facility]

## 2011-10-13 ENCOUNTER — Other Ambulatory Visit: Payer: Self-pay | Admitting: Family Medicine

## 2011-10-18 ENCOUNTER — Other Ambulatory Visit: Payer: Self-pay | Admitting: Family Medicine

## 2011-10-18 DIAGNOSIS — Z1231 Encounter for screening mammogram for malignant neoplasm of breast: Secondary | ICD-10-CM

## 2011-10-19 ENCOUNTER — Other Ambulatory Visit: Payer: Self-pay | Admitting: Family Medicine

## 2011-11-16 ENCOUNTER — Other Ambulatory Visit: Payer: Self-pay | Admitting: Family Medicine

## 2011-11-20 ENCOUNTER — Telehealth: Payer: Self-pay | Admitting: *Deleted

## 2011-11-20 MED ORDER — METOPROLOL TARTRATE 100 MG PO TABS: 100.0000 mg | ORAL_TABLET | Freq: Two times a day (BID) | ORAL | Status: DC

## 2011-11-20 NOTE — Telephone Encounter (Signed)
Can change to regular release instead of XR and it will be cheaper but will have to take it bid.

## 2011-11-20 NOTE — Telephone Encounter (Signed)
Pt would like to see if there is anything cheaper than the metoprolol. She states that she can't afford teh copay at this time. Please advise.

## 2011-11-21 NOTE — Telephone Encounter (Signed)
Lm to pt that rx has been sent to pharmacy

## 2011-12-05 ENCOUNTER — Inpatient Hospital Stay: Admission: RE | Admit: 2011-12-05 | Payer: Medicare Other | Source: Ambulatory Visit

## 2011-12-28 ENCOUNTER — Ambulatory Visit (INDEPENDENT_AMBULATORY_CARE_PROVIDER_SITE_OTHER): Payer: Medicare Other | Admitting: Family Medicine

## 2011-12-28 VITALS — BP 143/73 | HR 65 | Ht 66.0 in | Wt 190.0 lb

## 2011-12-28 DIAGNOSIS — I10 Essential (primary) hypertension: Secondary | ICD-10-CM | POA: Diagnosis not present

## 2011-12-28 DIAGNOSIS — E039 Hypothyroidism, unspecified: Secondary | ICD-10-CM | POA: Diagnosis not present

## 2011-12-28 DIAGNOSIS — Z1331 Encounter for screening for depression: Secondary | ICD-10-CM

## 2011-12-28 DIAGNOSIS — Z9181 History of falling: Secondary | ICD-10-CM

## 2011-12-28 DIAGNOSIS — E669 Obesity, unspecified: Secondary | ICD-10-CM | POA: Diagnosis not present

## 2011-12-28 MED ORDER — METOPROLOL SUCCINATE ER 200 MG PO TB24: 200.0000 mg | ORAL_TABLET | Freq: Every day | ORAL | Status: DC

## 2011-12-28 NOTE — Patient Instructions (Addendum)
Keep up the good work. Try to get your bloodwork.

## 2011-12-28 NOTE — Progress Notes (Signed)
  Subjective:    Patient ID: Brandy Tran, female    DOB: 04-11-1922, 78 y.o.   MRN: [id number]  Hypertension This is a chronic problem. The current episode started more than 1 year ago. The problem is controlled. Pertinent negatives include no chest pain or shortness of breath. There are no associated agents to hypertension. Past treatments include beta blockers and calcium channel blockers. The current treatment provides significant improvement. There are no compliance problems.   Has been working her salt intake.    Review of Systems  Respiratory: Negative for shortness of breath.   Cardiovascular: Negative for chest pain.       Objective:   Physical Exam  Constitutional: She is oriented to person, place, and time. She appears well-developed and well-nourished.  HENT:  Head: Normocephalic and atraumatic.  Cardiovascular: Normal rate, regular rhythm and normal heart sounds.   Pulmonary/Chest: Effort normal and breath sounds normal.  Musculoskeletal:        1+ ankle edema bilaterally.  Neurological: She is alert and oriented to person, place, and time.  Skin: Skin is warm and dry.  Psychiatric: She has a normal mood and affect. Her behavior is normal.          Assessment & Plan:  Hypertension-her blood pressure actually looks great today. It hasn't looked this good in a long time. She would like to change back to metoprolol extended release. She says it's difficult for him to take the metoprolol twice a day. She is overdue for CMP and lipid panel. I will order this today and she can follow up in 6 months.  Hypothyroid-she's due to recheck her TSH.  She declined a tetanus vaccine today. She says she can't remember when her last one was.  She did complete a depression questionnaire. Her PHQ 9 score was 1 today which is negative for depression. Also had her complete a fall assessment poor. Her score was 5, which is moderate risk. She currently uses a walker to assist in  ambulation. Her significant risk with her age and the fact that she does have some urinary frequency. At this point in time she is not interested in taking additional medications to control her bladder.

## 2012-01-03 LAB — LIPID PANEL
Cholesterol: 224 mg/dL — ABNORMAL HIGH (ref 0–200)
HDL: 72 mg/dL (ref >39)
LDL Cholesterol: 130 mg/dL — ABNORMAL HIGH (ref 0–99)
Triglycerides: 111 mg/dL (ref <150)
VLDL: 22 mg/dL (ref 0–40)

## 2012-01-04 LAB — COMPLETE METABOLIC PANEL WITH GFR
ALT: 9 U/L (ref 0–35)
AST: 17 U/L (ref 0–37)
Alkaline Phosphatase: 73 U/L (ref 39–117)
BUN: 19 mg/dL (ref 6–23)
Calcium: 9 mg/dL (ref 8.4–10.5)
Chloride: 103 mEq/L (ref 96–112)
Creat: 0.83 mg/dL (ref 0.50–1.10)
Total Bilirubin: 0.6 mg/dL (ref 0.3–1.2)

## 2012-02-12 ENCOUNTER — Other Ambulatory Visit: Payer: Self-pay | Admitting: Family Medicine

## 2012-02-21 ENCOUNTER — Other Ambulatory Visit: Payer: Self-pay | Admitting: Family Medicine

## 2012-04-08 IMAGING — CT CT ABD-PELV W/O CM
2 of 4 series · 16 of 46 positions shown, 18 images · non-contrast
Comparison: None

CLINICAL DATA: Abdominal pain.  Evaluate for diverticulitis.

CT ABDOMEN AND PELVIS WITHOUT CONTRAST
TECHNIQUE: Multidetector CT imaging of the abdomen and pelvis was
performed following the standard protocol without intravenous
contrast.

[Series 2: abd/pelvis without · axial · non-contrast · 0.78mm/px · z∈[-292,+68]mm · 13 of 79 slices shown, 15 images]
[im 4/79  soft-tissue]
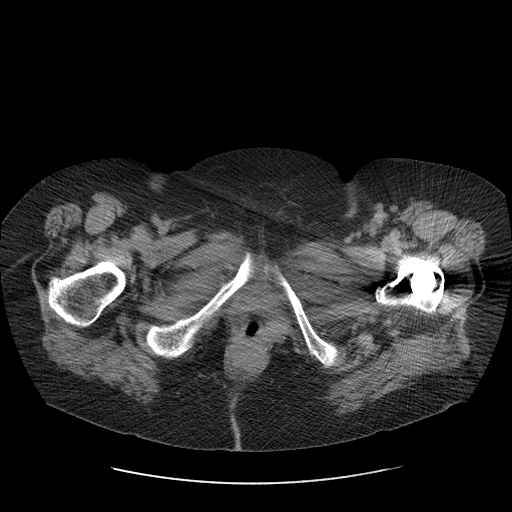
[im 4/79  bone]
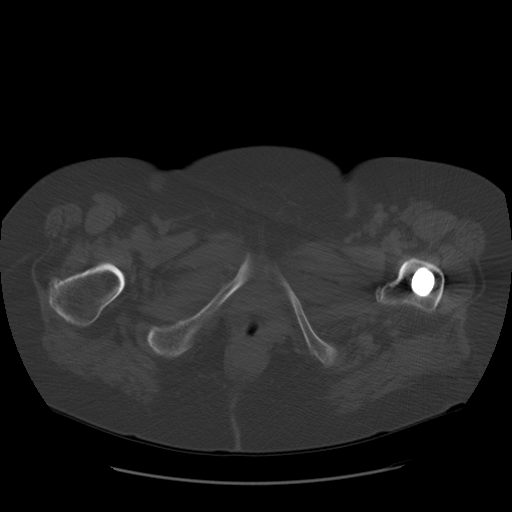
[im 11/79  soft-tissue]
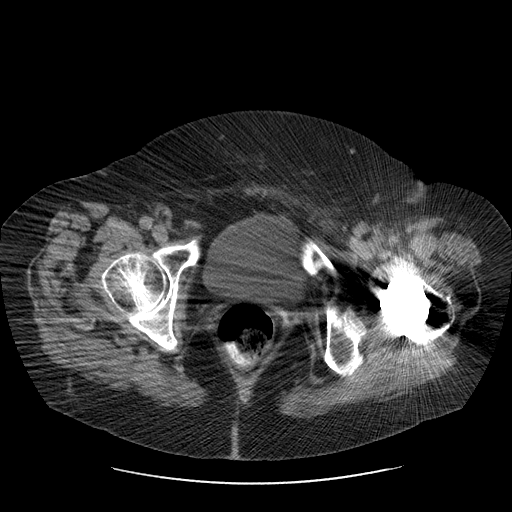
[im 17/79  soft-tissue]
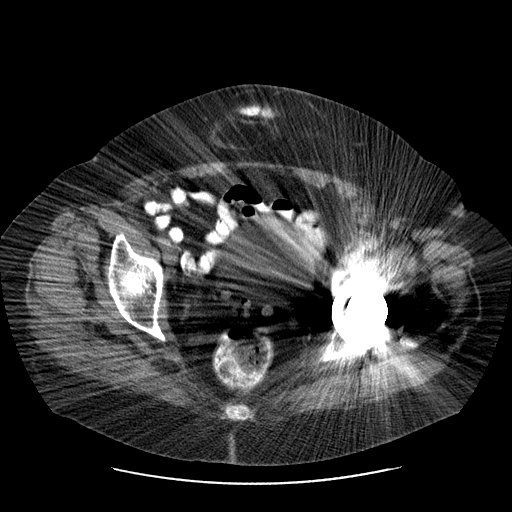
[im 21/79  soft-tissue]
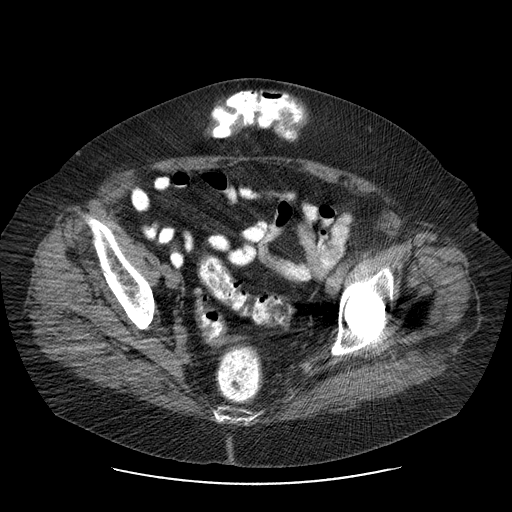
[im 28/79  soft-tissue]
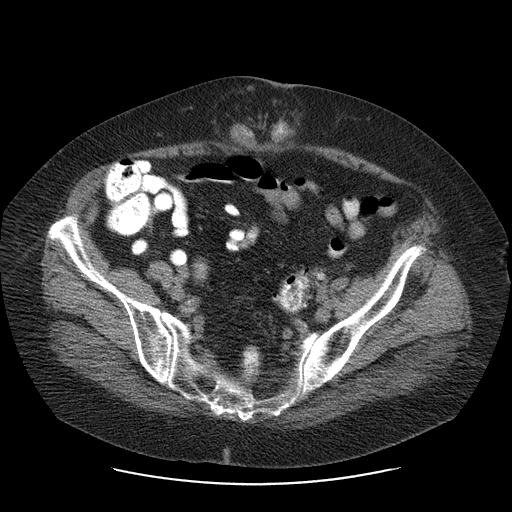
[im 34/79  soft-tissue]
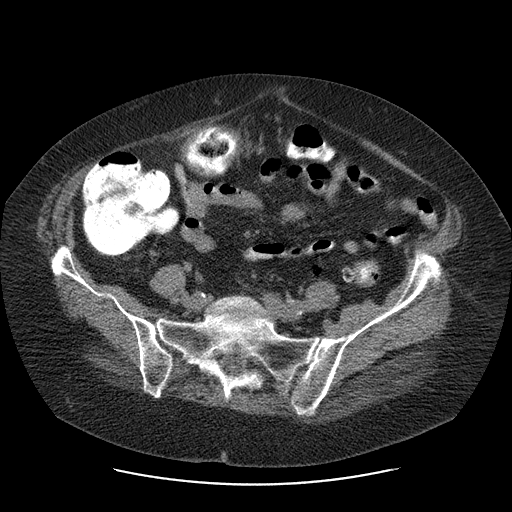
[im 41/79  soft-tissue]
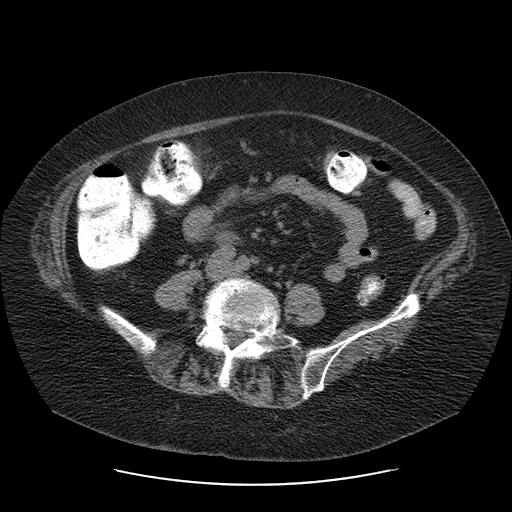
[im 45/79  soft-tissue]
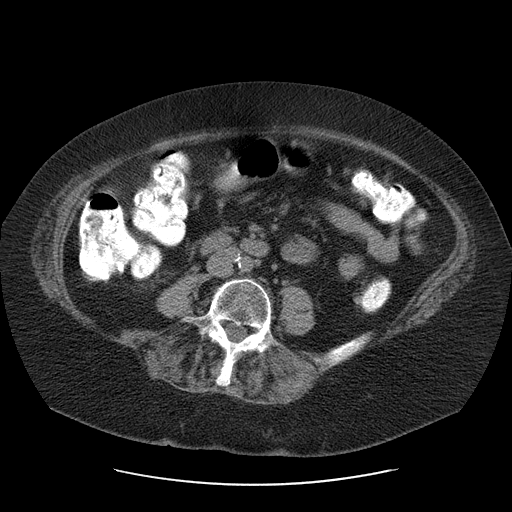
[im 51/79  soft-tissue]
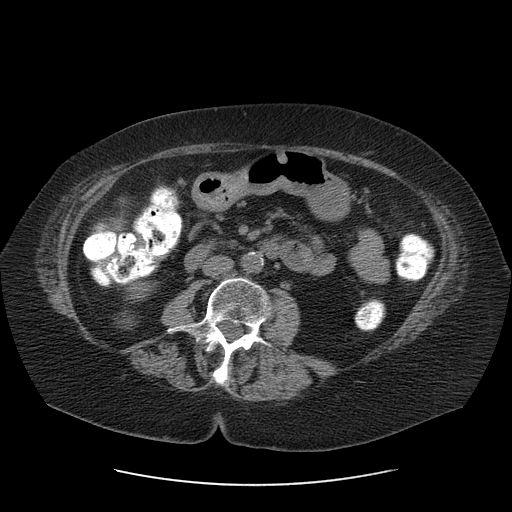
[im 51/79  bone]
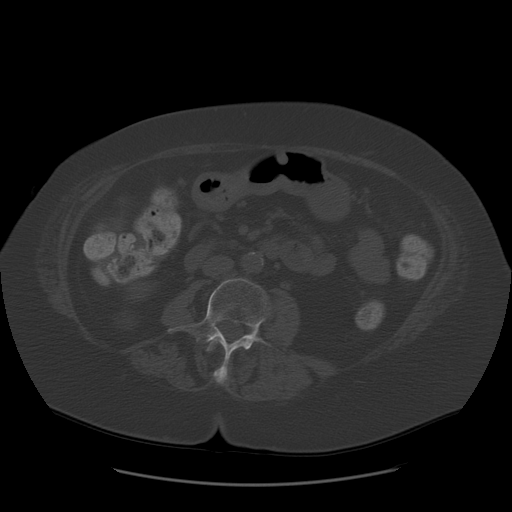
[im 58/79  soft-tissue]
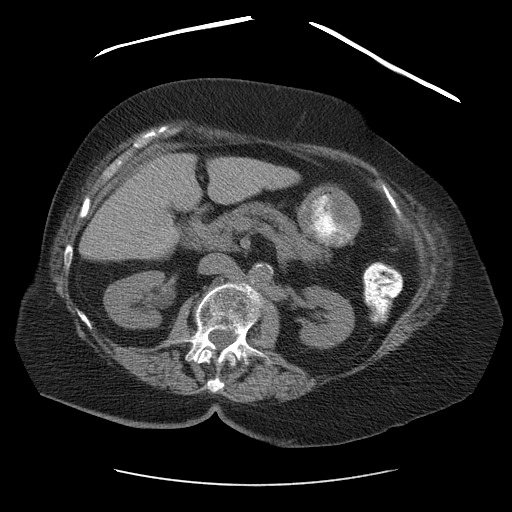
[im 62/79  soft-tissue]
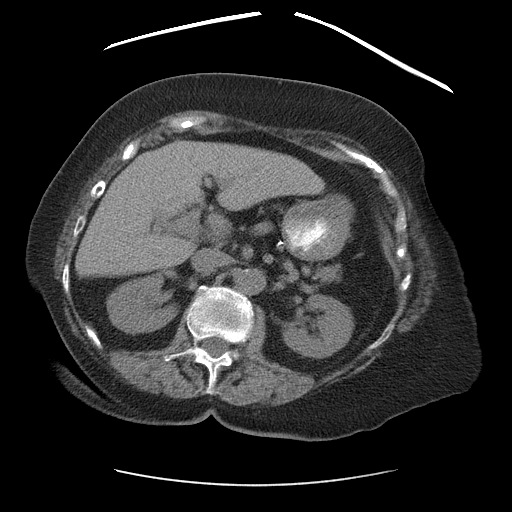
[im 68/79  soft-tissue]
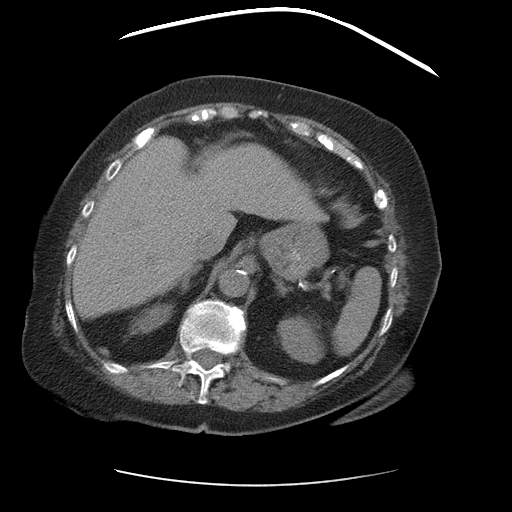
[im 75/79  soft-tissue]
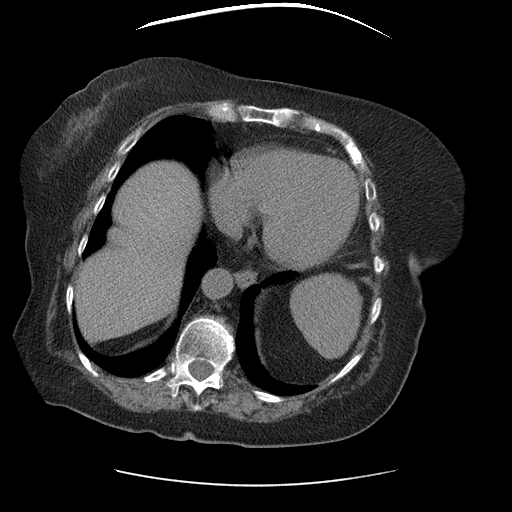

[Series 401: cor · coronal · 0.82mm/px · 3 of 141 slices shown]
[im 47/141  soft-tissue]
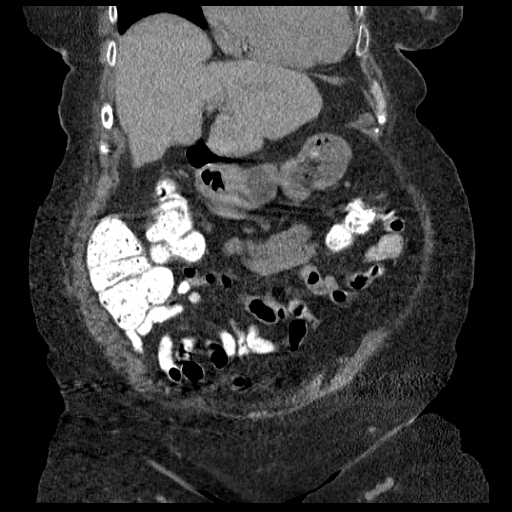
[im 63/141  soft-tissue]
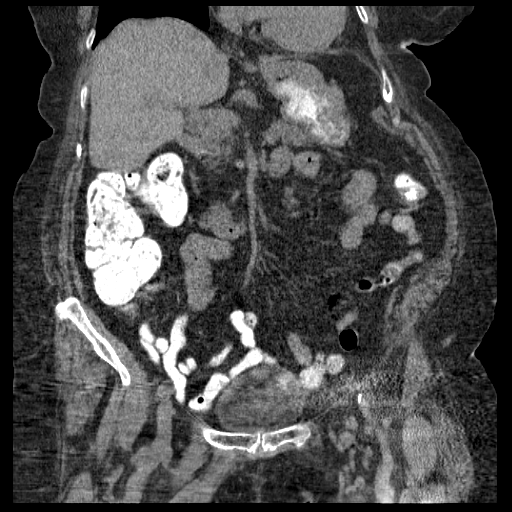
[im 78/141  soft-tissue]
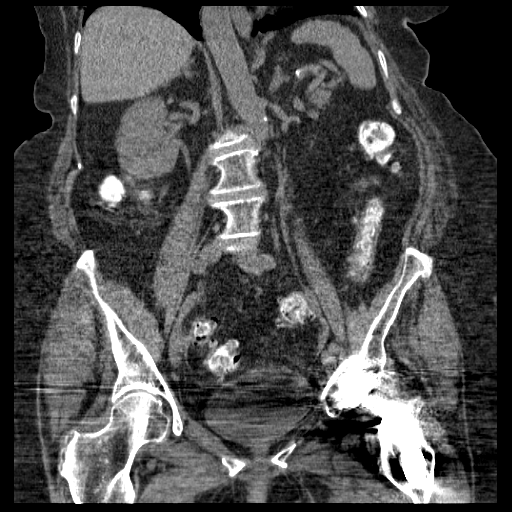

[16 of 46 positions shown; findings below may reference images not displayed]

FINDINGS: The lung bases appear clear.

There is no focal liver abnormalities identified.

The spleen appears normal.

The the pancreas is normal.

Both adrenal glands are normal.

The gallbladder is not visualized and may be surgically absent.  No
biliary dilatation.

The left kidney appears normal.

There is a fluid density structure within the posterior cortex of
the right kidney measuring 3 cm, image 27.  This is incompletely
characterized without IV contrast but likely represents a simple
cyst.

No enlarged lymph nodes within the upper abdomen. There is no free
fluid or abnormal fluid collections identified.

There is a peri umbilical hernia which contains a nonobstructed
loop of the transverse colon.  There is wall thickening and
pericolonic inflammatory changes involving the afferent loop
entering the hernia. There is no evidence for bowel perforation or
abscess.

The remaining portions of the colon appear normal.  There are
scattered diverticula identified without specific features to
suggest inflammation elsewhere in the colon.

 The urinary bladder is normal.  No pelvic or inguinal adenopathy
identified.

No free fluid or fluid collections in the pelvis.

Review of the visualized osseous structures shows a left hip
arthroplasty device.  There is degenerative disc disease and
scoliosis involving the lumbar spine.

.
IMPRESSION: 1.  Periumbilical hernia contains a nonobstructed loop of large
bowel.
2.  Afferent loop of bowel entering the hernia is mildly thickened
and there is mild pericolonic fat stranding.  Findings consistent
with a focal colitis.
3.  No evidence for perforation or abscess.

## 2012-04-17 ENCOUNTER — Other Ambulatory Visit: Payer: Self-pay | Admitting: Family Medicine

## 2012-05-12 ENCOUNTER — Other Ambulatory Visit: Payer: Self-pay | Admitting: Family Medicine

## 2012-05-17 ENCOUNTER — Other Ambulatory Visit: Payer: Self-pay | Admitting: Family Medicine

## 2012-06-28 ENCOUNTER — Encounter: Payer: Self-pay | Admitting: Family Medicine

## 2012-06-28 ENCOUNTER — Ambulatory Visit (INDEPENDENT_AMBULATORY_CARE_PROVIDER_SITE_OTHER): Payer: Medicare Other | Admitting: Family Medicine

## 2012-06-28 VITALS — BP 180/85 | HR 66 | Wt 192.0 lb

## 2012-06-28 DIAGNOSIS — N951 Menopausal and female climacteric states: Secondary | ICD-10-CM

## 2012-06-28 DIAGNOSIS — R002 Palpitations: Secondary | ICD-10-CM | POA: Diagnosis not present

## 2012-06-28 DIAGNOSIS — I10 Essential (primary) hypertension: Secondary | ICD-10-CM | POA: Diagnosis not present

## 2012-06-28 DIAGNOSIS — E039 Hypothyroidism, unspecified: Secondary | ICD-10-CM

## 2012-06-28 DIAGNOSIS — I447 Left bundle-branch block, unspecified: Secondary | ICD-10-CM

## 2012-06-28 LAB — TSH: TSH: 1.351 u[IU]/mL (ref 0.350–4.500)

## 2012-06-28 LAB — CBC
HCT: 39.3 % (ref 36.0–46.0)
Hemoglobin: 13.1 g/dL (ref 12.0–15.0)
MCHC: 33.3 g/dL (ref 30.0–36.0)
RBC: 4.25 MIL/uL (ref 3.87–5.11)

## 2012-06-28 NOTE — Patient Instructions (Signed)
We will call you with your lab results. If you don't here from us in about a week then please give us a call at [phone].  

## 2012-06-28 NOTE — Progress Notes (Signed)
Subjective:    Patient ID: Brandy Tran, female    DOB: 06/08/1922, 78 y.o.   MRN: [id number]  HPI Heart beating fast with activity.  Takes about 15 minutes to calm down. No CP.Feels SOB at the same time. Says she is very sedentary and doesn't do very much. Lives in an apartment.  She is not currently having CP or palps. Says did feel them walking form the waiting room. She does occasionally get dizzy with the episodes. Rest helps resolve her symptoms. She's never loss consciousness or fallen because of this. She takes her medications regularly. She says she's taking her blood pressure pills without difficulty. She did take them today.   Review of Systems BP 180/85  Pulse 66  Wt 192 lb (87.091 kg)    Allergies  Allergen Reactions  . Amoxicillin-Pot Clavulanate     REACTION: Vomit  . Neomycin-Polymyxin B Gu     REACTION: Rash    Past Medical History  Diagnosis Date  . Hiatal hernia   . Hiatal hernia     Past Surgical History  Procedure Date  . Left hip replacement s/p fracture   . Abdominal hysterectomy 1968    partial    History   Social History  . Marital Status: Widowed    Spouse Name: N/A    Number of Children: N/A  . Years of Education: N/A   Occupational History  . Not on file.   Social History Main Topics  . Smoking status: Never Smoker   . Smokeless tobacco: Not on file  . Alcohol Use:   . Drug Use:   . Sexually Active:    Other Topics Concern  . Not on file   Social History Narrative  . No narrative on file    No family history on file.  Outpatient Encounter Prescriptions as of 06/28/2012  Medication Sig Dispense Refill  . amLODipine (NORVASC) 5 MG tablet take 1 tablet by mouth once daily  30 tablet  2  . levothyroxine (SYNTHROID, LEVOTHROID) 100 MCG tablet take 1 tablet by mouth once daily  90 tablet  2  . metoprolol (TOPROL-XL) 200 MG 24 hr tablet Take 1 tablet (200 mg total) by mouth daily.  90 tablet  3  . nitroGLYCERIN (NITRODUR - DOSED  IN MG/24 HR) 0.2 mg/hr apply 1 patch once daily  90 patch  0  . DISCONTD: levothyroxine (SYNTHROID, LEVOTHROID) 100 MCG tablet take 1 tablet by mouth once daily  90 tablet  2  . DISCONTD: nitroGLYCERIN (NITRODUR - DOSED IN MG/24 HR) 0.2 mg/hr apply 1 patch once daily  90 patch  0          Objective:   Physical Exam  Constitutional: She is oriented to person, place, and time. She appears well-developed and well-nourished.  HENT:  Head: Normocephalic and atraumatic.  Neck: Neck supple. No thyromegaly present.  Cardiovascular: Normal rate and regular rhythm.        3/6 SEM mumur, with sharp S2. No carotid bruits   Pulmonary/Chest: Effort normal and breath sounds normal.  Musculoskeletal: She exhibits no edema.  Lymphadenopathy:    She has no cervical adenopathy.  Neurological: She is alert and oriented to person, place, and time.  Skin: Skin is warm and dry.  Psychiatric: She has a normal mood and affect. Her behavior is normal.          Assessment & Plan:  Palpitations - will get EKG.  check thyroid to make sure hormone replacement is adequate.  Also check CBC to rule out anemia. Also suspect that she could have some aortic stenosis because of her murmur today. I would like to schedule further workup with either an echo or referral to cardiology. Also consider that it may just be part of deconditioning since she is very sedentary.EKG today shows rate of 66 bpm, NSR with LBBB. Not sure if new or not. Will refer to cardiology for further evalution. Likely needs echo.    HTN- Uncontrolled today.  BP was mubh better last time. I meant to recheck before she left. I would liek to see her BP once has been sitting for 5 minutes.    Hypothyroidism-due to recheck TSH today. Her last normal level was 6 months ago.

## 2012-06-29 LAB — COMPLETE METABOLIC PANEL WITH GFR
ALT: 9 U/L (ref 0–35)
Alkaline Phosphatase: 66 U/L (ref 39–117)
GFR, Est Non African American: 65 mL/min
Glucose, Bld: 91 mg/dL (ref 70–99)
Sodium: 140 mEq/L (ref 135–145)
Total Bilirubin: 0.4 mg/dL (ref 0.3–1.2)
Total Protein: 6.7 g/dL (ref 6.0–8.3)

## 2012-06-29 NOTE — Progress Notes (Signed)
Quick Note:  All labs are normal. ______ 

## 2012-07-15 ENCOUNTER — Other Ambulatory Visit: Payer: Self-pay | Admitting: Family Medicine

## 2012-07-17 ENCOUNTER — Encounter: Payer: Self-pay | Admitting: Cardiology

## 2012-07-17 ENCOUNTER — Ambulatory Visit (INDEPENDENT_AMBULATORY_CARE_PROVIDER_SITE_OTHER): Payer: Medicare Other | Admitting: Cardiology

## 2012-07-17 VITALS — BP 149/82 | HR 70 | Ht 67.0 in | Wt 195.0 lb

## 2012-07-17 DIAGNOSIS — R002 Palpitations: Secondary | ICD-10-CM

## 2012-07-17 DIAGNOSIS — I10 Essential (primary) hypertension: Secondary | ICD-10-CM | POA: Diagnosis not present

## 2012-07-17 DIAGNOSIS — R0989 Other specified symptoms and signs involving the circulatory and respiratory systems: Secondary | ICD-10-CM

## 2012-07-17 DIAGNOSIS — R011 Cardiac murmur, unspecified: Secondary | ICD-10-CM

## 2012-07-17 DIAGNOSIS — R0609 Other forms of dyspnea: Secondary | ICD-10-CM | POA: Diagnosis not present

## 2012-07-17 DIAGNOSIS — R0600 Dyspnea, unspecified: Secondary | ICD-10-CM

## 2012-07-17 MED ORDER — ASPIRIN EC 81 MG PO TBEC: 81.0000 mg | DELAYED_RELEASE_TABLET | Freq: Every day | ORAL | Status: DC

## 2012-07-17 NOTE — Assessment & Plan Note (Signed)
Blood pressure is mildly elevated. I have asked them to purchase a blood pressure cuff and monitor this at home. I am hesitant to increase medications for fear of causing hypotension in this elderly female. We will make adjustments based on followup blood pressure readings.

## 2012-07-17 NOTE — Progress Notes (Signed)
  HPI: 78 year old female for evaluation of dyspnea and palpitations. The patient has had progressive increasing dyspnea on exertion for 2 years. She now has dyspnea with minimal activities. There is no orthopnea or PND but she does have chronic pedal edema. She also notices her heart rate pounding when she exerts herself. She occasionally has indigestion predominately after eating. She does not have exertional chest pain. She has occasional dizziness but no syncope. Because of the above cardiology was asked to evaluate.  Current Outpatient Prescriptions  Medication Sig Dispense Refill  . amLODipine (NORVASC) 5 MG tablet take 1 tablet by mouth once daily  30 tablet  2  . levothyroxine (SYNTHROID, LEVOTHROID) 100 MCG tablet take 1 tablet by mouth once daily  90 tablet  2  . metoprolol (TOPROL-XL) 200 MG 24 hr tablet Take 1 tablet (200 mg total) by mouth daily.  90 tablet  3  . nitroGLYCERIN (NITRODUR - DOSED IN MG/24 HR) 0.2 mg/hr apply 1 patch once daily  90 patch  0  . aspirin EC 81 MG tablet Take 1 tablet (81 mg total) by mouth daily.  90 tablet  3    Allergies  Allergen Reactions  . Amoxicillin-Pot Clavulanate     REACTION: Vomit  . Neomycin-Polymyxin B Gu     REACTION: Rash    Past Medical History  Diagnosis Date  . Hiatal hernia   . Hypothyroid   . Angina pectoris   . GERD (gastroesophageal reflux disease)   . Esophageal stricture     Past Surgical History  Procedure Date  . Left hip replacement s/p fracture   . Abdominal hysterectomy 1968    partial  . Tonsillectomy   . Elbow surgery     Bilateral  . Cholecystectomy     History   Social History  . Marital Status: Widowed    Spouse Name: N/A    Number of Children: 2  . Years of Education: N/A   Occupational History  . Not on file.   Social History Main Topics  . Smoking status: Never Smoker   . Smokeless tobacco: Not on file  . Alcohol Use: No  . Drug Use: No  . Sexually Active: Not Currently   Other  Topics Concern  . Not on file   Social History Narrative  . No narrative on file    Family History  Problem Relation Age of Onset  . Heart disease Mother     Unknown type    ROS: no fevers or chills, productive cough, hemoptysis, dysphasia, odynophagia, melena, hematochezia, dysuria, hematuria, rash, seizure activity, orthopnea, PND, claudication. Remaining systems are negative.  Physical Exam:   Blood pressure 149/82, pulse 70, height 5\' 7"  (1.702 m), weight 195 lb (88.451 kg).  General:  Well developed/obese in NAD Skin warm/dry Patient not depressed No peripheral clubbing Back-normal HEENT-normal/normal eyelids Neck supple/normal carotid upstroke bilaterally; no bruits; no JVD; no thyromegaly chest - CTA/ normal expansion CV - RRR/normal S1 and S2; no rubs or gallops;  PMI nondisplaced, 3 over 6 systolic murmur left sternal border. S2 is not diminished. Abdomen -NT/ND, no HSM, no mass, + bowel sounds, no bruit; hernia noted. 2+ femoral pulses, no bruits Ext-1+ edema, no chords, 2+ DP Neuro-grossly nonfocal  ECG NSR, LBBB

## 2012-07-17 NOTE — Assessment & Plan Note (Signed)
We'll consider monitor in the future if these worsen. Continue beta blocker for now.

## 2012-07-17 NOTE — Patient Instructions (Addendum)
Your physician recommends that you schedule a follow-up appointment in: 4-6 WEEKS WITH DR Jens Som IN [place]  Your physician has requested that you have an echocardiogram. Echocardiography is a painless test that uses sound waves to create images of your heart. It provides your doctor with information about the size and shape of your heart and how well your heart's chambers and valves are working. This procedure takes approximately one hour. There are no restrictions for this procedure.   START ASPIRIN 81 MG ONCE DAILY WITH FOOD

## 2012-07-17 NOTE — Assessment & Plan Note (Signed)
Patient complains of progressive dyspnea on exertion as well as palpitations with exertion. She has an aortic stenosis murmur on examination. I had a long discussion with she and her daughter today. She wants conservative measures if possible. She would not consider valve replacement in the future. Plan echocardiogram to assess LV function and aortic stenosis to help guide therapy. I discussed a functional study to screen for coronary disease. However she would not be agreeable for cardiac catheterization in the future and we will therefore not pursue functional study. We will continue with antianginals for possible anginal equivalent. Continue Norvasc, beta blocker and nitrates. Add aspirin 81 mg daily. I will see her back in 4-6 weeks to review her studies and her symptoms. As stated previously she wants only conservative therapy if possible.

## 2012-07-24 ENCOUNTER — Other Ambulatory Visit (HOSPITAL_COMMUNITY): Payer: Medicare Other

## 2012-07-30 ENCOUNTER — Other Ambulatory Visit: Payer: Self-pay

## 2012-07-30 ENCOUNTER — Ambulatory Visit (HOSPITAL_COMMUNITY): Payer: Medicare Other | Attending: Cardiology

## 2012-07-30 DIAGNOSIS — I080 Rheumatic disorders of both mitral and aortic valves: Secondary | ICD-10-CM | POA: Insufficient documentation

## 2012-07-30 DIAGNOSIS — R011 Cardiac murmur, unspecified: Secondary | ICD-10-CM | POA: Insufficient documentation

## 2012-07-30 DIAGNOSIS — I379 Nonrheumatic pulmonary valve disorder, unspecified: Secondary | ICD-10-CM | POA: Insufficient documentation

## 2012-07-30 DIAGNOSIS — I10 Essential (primary) hypertension: Secondary | ICD-10-CM | POA: Diagnosis not present

## 2012-07-30 DIAGNOSIS — I369 Nonrheumatic tricuspid valve disorder, unspecified: Secondary | ICD-10-CM | POA: Insufficient documentation

## 2012-07-30 NOTE — Progress Notes (Signed)
Echocardiogram performed.  

## 2012-08-21 ENCOUNTER — Encounter: Payer: Self-pay | Admitting: Cardiology

## 2012-08-21 ENCOUNTER — Ambulatory Visit (INDEPENDENT_AMBULATORY_CARE_PROVIDER_SITE_OTHER): Payer: Medicare Other | Admitting: Cardiology

## 2012-08-21 VITALS — BP 167/78 | HR 60 | Wt 191.0 lb

## 2012-08-21 DIAGNOSIS — R0989 Other specified symptoms and signs involving the circulatory and respiratory systems: Secondary | ICD-10-CM | POA: Diagnosis not present

## 2012-08-21 DIAGNOSIS — R0609 Other forms of dyspnea: Secondary | ICD-10-CM

## 2012-08-21 DIAGNOSIS — I10 Essential (primary) hypertension: Secondary | ICD-10-CM | POA: Diagnosis not present

## 2012-08-21 DIAGNOSIS — R0600 Dyspnea, unspecified: Secondary | ICD-10-CM

## 2012-08-21 MED ORDER — AMLODIPINE BESYLATE 10 MG PO TABS: 10.0000 mg | ORAL_TABLET | Freq: Every day | ORAL | Status: DC

## 2012-08-21 NOTE — Patient Instructions (Addendum)
Your physician wants you to follow-up in: 6 MONTHS WITH DR Jens Som IN [place] You will receive a reminder letter in the mail two months in advance. If you don't receive a letter, please call our office to schedule the follow-up appointment.   INCREASE AMLODIPINE TO 10 MG ONCE DAILY

## 2012-08-21 NOTE — Assessment & Plan Note (Signed)
Blood pressure elevated. Increase amlodipine to 10 mg daily. 

## 2012-08-21 NOTE — Progress Notes (Signed)
   HPI: 78 year old female I initially saw in Sept 2013 for evaluation of dyspnea and palpitations. Laboratories in August of 2013 showed a normal TSH, normal potassium and normal hemoglobin. Echocardiogram in September of 2013 showed normal LV function, moderate left ventricular hypertrophy, grade 1 diastolic dysfunction, mild aortic insufficiency, mild mitral regurgitation, mild left atrial enlargement and a mildly elevated LVOT gradient of 15 mm of mercury. When I saw her previously it was clear the patient wanted conservative measures if possible. We discussed a functional study to screen for coronary disease but she declined as she would not be willing to pursue cardiac catheterization in the future. Since last saw her she continues to have dyspnea on exertion. Her pedal edema is chronic and at baseline. No orthopnea or PND. No chest pain or syncope. Occasional palpitations.  Current Outpatient Prescriptions  Medication Sig Dispense Refill  . amLODipine (NORVASC) 5 MG tablet take 1 tablet by mouth once daily  30 tablet  2  . aspirin EC 81 MG tablet Take 1 tablet (81 mg total) by mouth daily.  90 tablet  3  . levothyroxine (SYNTHROID, LEVOTHROID) 100 MCG tablet take 1 tablet by mouth once daily  90 tablet  2  . metoprolol (TOPROL-XL) 200 MG 24 hr tablet Take 1 tablet (200 mg total) by mouth daily.  90 tablet  3  . nitroGLYCERIN (NITRODUR - DOSED IN MG/24 HR) 0.2 mg/hr apply 1 patch once daily  90 patch  0     Past Medical History  Diagnosis Date  . Hiatal hernia   . Hypothyroid   . Angina pectoris   . GERD (gastroesophageal reflux disease)   . Esophageal stricture   . Aortic stenosis     Past Surgical History  Procedure Date  . Left hip replacement s/p fracture   . Abdominal hysterectomy 1968    partial  . Tonsillectomy   . Elbow surgery     Bilateral  . Cholecystectomy     History   Social History  . Marital Status: Widowed    Spouse Name: N/A    Number of Children: 2  .  Years of Education: N/A   Occupational History  . Not on file.   Social History Main Topics  . Smoking status: Never Smoker   . Smokeless tobacco: Not on file  . Alcohol Use: No  . Drug Use: No  . Sexually Active: Not Currently   Other Topics Concern  . Not on file   Social History Narrative  . No narrative on file    ROS: no fevers or chills, productive cough, hemoptysis, dysphasia, odynophagia, melena, hematochezia, dysuria, hematuria, rash, seizure activity, orthopnea, PND, pedal edema, claudication. Remaining systems are negative.  Physical Exam: Well-developed well-nourished in no acute distress.  Skin is warm and dry.  HEENT is normal.  Neck is supple.  Chest is clear to auscultation with normal expansion.  Cardiovascular exam is regular rate and rhythm. 2/6 systolic murmur left sternal border. Increases with Valsalva. Abdominal exam nontender or distended. No masses palpated. Extremities show 1+ edema. neuro grossly intact

## 2012-08-21 NOTE — Assessment & Plan Note (Signed)
No significant change in symptoms. LV function is normal. Her aortic valve appears to be opening well. She does have a mild LVOT gradient. Continue beta-blockade. We discussed screening for coronary disease previously but she does not want to pursue this as she would never agree to cardiac catheterization. She wants medical therapy only.  

## 2012-08-23 ENCOUNTER — Ambulatory Visit (INDEPENDENT_AMBULATORY_CARE_PROVIDER_SITE_OTHER): Payer: PRIVATE HEALTH INSURANCE | Admitting: Family Medicine

## 2012-08-23 DIAGNOSIS — Z23 Encounter for immunization: Secondary | ICD-10-CM

## 2012-08-23 NOTE — Progress Notes (Signed)
  Subjective:    Patient ID: Brandy Tran, female    DOB: [birth year], 78 y.o.   MRN: [id number]  HPI   Here for flu vaccine  Review of Systems     Objective:   Physical Exam        Assessment & Plan:

## 2012-08-23 NOTE — Progress Notes (Signed)
noted 

## 2012-09-16 ENCOUNTER — Ambulatory Visit (INDEPENDENT_AMBULATORY_CARE_PROVIDER_SITE_OTHER): Payer: Medicare Other | Admitting: Physician Assistant

## 2012-09-16 ENCOUNTER — Encounter: Payer: Self-pay | Admitting: Physician Assistant

## 2012-09-16 VITALS — BP 132/76 | HR 68 | Temp 97.7°F | Ht 67.0 in | Wt 190.0 lb

## 2012-09-16 DIAGNOSIS — R000 Tachycardia, unspecified: Secondary | ICD-10-CM

## 2012-09-16 DIAGNOSIS — J069 Acute upper respiratory infection, unspecified: Secondary | ICD-10-CM | POA: Diagnosis not present

## 2012-09-16 DIAGNOSIS — B029 Zoster without complications: Secondary | ICD-10-CM

## 2012-09-16 MED ORDER — TRAMADOL HCL 50 MG PO TABS: 50.0000 mg | ORAL_TABLET | Freq: Three times a day (TID) | ORAL | Status: DC | PRN

## 2012-09-16 MED ORDER — FAMCICLOVIR 500 MG PO TABS: 500.0000 mg | ORAL_TABLET | Freq: Three times a day (TID) | ORAL | Status: DC

## 2012-09-16 MED ORDER — LIDOCAINE HCL 2 % EX GEL: Freq: Two times a day (BID) | CUTANEOUS | Status: DC

## 2012-09-16 MED ORDER — DOXYCYCLINE HYCLATE 100 MG PO CAPS: 100.0000 mg | ORAL_CAPSULE | Freq: Two times a day (BID) | ORAL | Status: DC

## 2012-09-16 NOTE — Progress Notes (Signed)
  Subjective:    Patient ID: Brandy Tran, female    DOB: 08-30-1922, 78 y.o.   MRN: [id number]  HPI Patient is a pleasant 78 yo female who presents to the clinic accompanied by her son with cough, nasal drainage, and scratchy throat. She has had this for 3 days since Friday. She denies any fever, chills, muscle aches, ear pain. Her nasal drainage has actually gotten better. She has not tried anything OTC to make better. She denies any cough or SOB. She has not had any sinus pressure but has had a headache for 1 day. She does feel like her heart is beating fast for the past week. She sees a cardiologist for murmur. She has not called him. She states that it can happen at anytime sitting, standing, getting up, at night.   She also comes in with a rash on her right shoulder. She reports she has had this before and Dr. Linford Arnold gave her something that made it better. She describes the rash as burning and itchy. She denies any lesion or open areas. She has not tried anything on it.     Review of Systems     Objective:   Physical Exam  Constitutional: She is oriented to person, place, and time. She appears well-developed and well-nourished.  HENT:  Head: Normocephalic and atraumatic.  Right Ear: External ear normal.  Left Ear: External ear normal.  Nose: Nose normal.  Mouth/Throat: Oropharynx is clear and moist.       TM's normal.  No maxillary or frontal tenderness.  Eyes: Conjunctivae normal are normal.  Neck: Normal range of motion. Neck supple.  Cardiovascular: Normal rate and regular rhythm.   Murmur heard.      Systolic ejection murmur.  Pulmonary/Chest: Effort normal and breath sounds normal. She has no wheezes.  Lymphadenopathy:    She has no cervical adenopathy.  Neurological: She is alert and oriented to person, place, and time.  Skin:       Erythematous patch on right shoulder with a few 1-2cm papules. No active drainage.   Psychiatric: She has a normal mood and affect. Her  behavior is normal.          Assessment & Plan:  URI- Likely viral. She seems to be getting better today. Gave handout of URI. Encouraged honey and zinc for cough. Did give Doxycycline since allergy to Augmentin and has cardiac problems not good to take zpak to take if not improving in 4 days. I reassured them that I did not think bacterial but if this persisted to try abx.   Shingles- Likely based on her age and same dermatome. Based on Clinic exam.Gave tramadol for pain to use as needed. Gave lidocaine gel BID as needed. GAve famvir for 10 days. Call if not improving.   Increased heart rate- Reassured patient that today her HR was 68 and we consider that normal. I told her to call cardiologist they may want to put a holter monitor on her and see what her heart rate is doing. If continues or feels SOB or starts to have CP to go to ER or call cardiologist.

## 2012-09-16 NOTE — Patient Instructions (Addendum)
Consider zinc tablets(cold eeze) tablets along with honey for cough.  Wait for next 4 days and if not getting any better then can take antibiotic that I handed to you.   Sent to pharmacy: Tramadol to take by mouth for pain as needed. Famvir three times a day for 10 days.  Lidocaine jelly twice a day for pain.   Upper Respiratory Infection, Adult An upper respiratory infection (URI) is also known as the common cold. It is often caused by a type of germ (virus). Colds are easily spread (contagious). You can pass it to others by kissing, coughing, sneezing, or drinking out of the same glass. Usually, you get better in 1 or 2 weeks.  HOME CARE   Only take medicine as told by your doctor.  Use a warm mist humidifier or breathe in steam from a hot shower.  Drink enough water and fluids to keep your pee (urine) clear or pale yellow.  Get plenty of rest.  Return to work when your temperature is back to normal or as told by your doctor. You may use a face mask and wash your hands to stop your cold from spreading. GET HELP RIGHT AWAY IF:   After the first few days, you feel you are getting worse.  You have questions about your medicine.  You have chills, shortness of breath, or brown or red spit (mucus).  You have yellow or brown snot (nasal discharge) or pain in the face, especially when you bend forward.  You have a fever, puffy (swollen) neck, pain when you swallow, or white spots in the back of your throat.  You have a bad headache, ear pain, sinus pain, or chest pain.  You have a high-pitched whistling sound when you breathe in and out (wheezing).  You have a lasting cough or cough up blood.  You have sore muscles or a stiff neck. MAKE SURE YOU:   Understand these instructions.  Will watch your condition.  Will get help right away if you are not doing well or get worse. Document Released: 04/10/2008 Document Revised: 01/15/2012 Document Reviewed: 02/27/2011 [facility]  Patient Information 2013 [place], [place].

## 2012-11-04 ENCOUNTER — Ambulatory Visit (INDEPENDENT_AMBULATORY_CARE_PROVIDER_SITE_OTHER): Payer: Medicare Other | Admitting: Family Medicine

## 2012-11-04 ENCOUNTER — Telehealth: Payer: Self-pay | Admitting: Family Medicine

## 2012-11-04 ENCOUNTER — Encounter: Payer: Self-pay | Admitting: Family Medicine

## 2012-11-04 VITALS — BP 140/72 | HR 66 | Temp 97.5°F | Resp 16 | Wt 189.0 lb

## 2012-11-04 DIAGNOSIS — I208 Other forms of angina pectoris: Secondary | ICD-10-CM

## 2012-11-04 DIAGNOSIS — I209 Angina pectoris, unspecified: Secondary | ICD-10-CM | POA: Diagnosis not present

## 2012-11-04 DIAGNOSIS — R55 Syncope and collapse: Secondary | ICD-10-CM

## 2012-11-04 DIAGNOSIS — E039 Hypothyroidism, unspecified: Secondary | ICD-10-CM

## 2012-11-04 DIAGNOSIS — L259 Unspecified contact dermatitis, unspecified cause: Secondary | ICD-10-CM

## 2012-11-04 DIAGNOSIS — I2089 Other forms of angina pectoris: Secondary | ICD-10-CM

## 2012-11-04 DIAGNOSIS — I10 Essential (primary) hypertension: Secondary | ICD-10-CM

## 2012-11-04 NOTE — Patient Instructions (Addendum)
If the patch continues to breaking out please let me know. We might be able to switch brands and this might help. Or we could consider going to a pill version of the nitroglycerin such as Imdur. I would like you to notify your cardiologist of your syncopal, passing out, episode. Please cut your Toprol in half. Take half in the morning and half in the evening. If he still felt very washed out and fatigued he can decrease to half a tab once a day and see if this is helpful.

## 2012-11-04 NOTE — Telephone Encounter (Signed)
Please call Dr. Lewayne Bunting office and try to get her scheduled for evaluation/followup appointment. She had a syncopal episode about a month ago. She says she was having palpitations around the time. That sounds like he may have resolved. Her angina is also not well controlled.

## 2012-11-04 NOTE — Progress Notes (Signed)
Subjective:    Patient ID: Brandy Tran, female    DOB: July 30, 1922, 78 y.o.   MRN: [id number]  HPI Says just feels really weak the last 2 months. Says doesn't feel right. Says she passed out about a month ago while sitting at the table eating a hamburger. Thinks it lasted just a few seconds. Says didn't feel bad that day. She denies any chest pain or shortness of breath or dizziness that day. She denies any palpitations that day.  Did have a cuple of weeks when felt her heart was racing around that time. But last few weeks has felt fine, and says it has resolved.  Has been on increased metoprolol since last Feb and feels it is making he rmore tired. Has been having occ of reflux, but not frequently.   She's also having problems with her nitroglycerin patch. She says that when she applies over her chest she gets irritation redness from it. She did have a history of shingles and thinks that somehow connected. She did try moving at her left upper arm and says that she feels like it's not as effective as when it's actually on her chest. She also notes a little bit of irritation and redness when she removes it on her left upper arm as well.  She says the patch is the same and has not changed recently. The brand is the same.   Review of Systems  BP 140/72  Pulse 66  Temp 97.5 F (36.4 C) (Oral)  Resp 16  Wt 189 lb (85.73 kg)  SpO2 95%    Allergies  Allergen Reactions  . Amoxicillin-Pot Clavulanate     REACTION: Vomit  . Neomycin-Polymyxin B Gu     REACTION: Rash    Past Medical History  Diagnosis Date  . Hiatal hernia   . Hypothyroid   . Angina pectoris   . GERD (gastroesophageal reflux disease)   . Esophageal stricture   . Aortic stenosis     Past Surgical History  Procedure Date  . Left hip replacement s/p fracture   . Abdominal hysterectomy 1968    partial  . Tonsillectomy   . Elbow surgery     Bilateral  . Cholecystectomy     History   Social History  . Marital  Status: Widowed    Spouse Name: N/A    Number of Children: 2  . Years of Education: N/A   Occupational History  . Not on file.   Social History Main Topics  . Smoking status: Never Smoker   . Smokeless tobacco: Not on file  . Alcohol Use: No  . Drug Use: No  . Sexually Active: Not Currently   Other Topics Concern  . Not on file   Social History Narrative  . No narrative on file    Family History  Problem Relation Age of Onset  . Heart disease Mother     Unknown type    Outpatient Encounter Prescriptions as of 11/04/2012  Medication Sig Dispense Refill  . amLODipine (NORVASC) 10 MG tablet Take 1 tablet (10 mg total) by mouth daily.  30 tablet  12  . aspirin EC 81 MG tablet Take 1 tablet (81 mg total) by mouth daily.  90 tablet  3  . doxycycline (VIBRAMYCIN) 100 MG capsule Take 1 capsule (100 mg total) by mouth 2 (two) times daily. For 10 days.  20 capsule  0  . famciclovir (FAMVIR) 500 MG tablet Take 1 tablet (500 mg total) by mouth 3 (  three) times daily. Fir 10 days.  30 tablet  0  . levothyroxine (SYNTHROID, LEVOTHROID) 100 MCG tablet take 1 tablet by mouth once daily  90 tablet  2  . lidocaine (XYLOCAINE) 2 % jelly Apply topically 2 (two) times daily. As needed.  30 mL  0  . metoprolol (TOPROL-XL) 200 MG 24 hr tablet Take 1 tablet (200 mg total) by mouth daily.  90 tablet  3  . nitroGLYCERIN (NITRODUR - DOSED IN MG/24 HR) 0.2 mg/hr apply 1 patch once daily  90 patch  0  . traMADol (ULTRAM) 50 MG tablet Take 1 tablet (50 mg total) by mouth every 8 (eight) hours as needed for pain.  20 tablet  0          Objective:   Physical Exam  Constitutional: She is oriented to person, place, and time. She appears well-developed and well-nourished.  HENT:  Head: Normocephalic and atraumatic.  Eyes: Conjunctivae normal are normal. Pupils are equal, round, and reactive to light.  Neck: Neck supple. No thyromegaly present.  Cardiovascular: Normal rate, regular rhythm and normal  heart sounds.        2/6SEM. No carotid bruits.  Pulmonary/Chest: Effort normal and breath sounds normal.  Musculoskeletal: She exhibits no edema.  Lymphadenopathy:    She has no cervical adenopathy.  Neurological: She is alert and oriented to person, place, and time.  Skin: Skin is warm and dry.       No evidence of rash on her skin today.  Psychiatric: She has a normal mood and affect. Her behavior is normal.          Assessment & Plan:  Rash from nitro patch - I. suspect this is a localized reaction from the gluteus. We discussed possibly changing brands. We have done this in the past before. She says she would like to retry putting on her chest again and see if the irritation is less. Especially since she feels like to see on the chest is more effective for relieving her chest discomfort.  Syncope - Unclear etiology. Because she had some heart racing on and off for couple of weeks I am concerned that she may have had an arrhythmia. It's possible she has been going in and out of atrial fibrillation. Will get EKG. Will need to let her cardiologist know. Will check CBC, TSH, CMP to rule out anemia, thyroid abnormalities, and electron disturbances. He EKG showed left bundle branch block, rate of 65 beats per minute, with leftward deviated axis. This is unchanged from previous EKG from August of 2013.  Angina - I'm not sure if her chest pain she is describing is part of her typical angina or may then be reflux related that she denies any heartburn type symptoms or belching, except infrequently. If she still does not tolerate the patch for me to consider switching her to an oral nitrate medication.  Hypertension-well-controlled based on her age today. I am concerned about her fatigue which certainly could be coming from the beta blocker. I would like her to start with cutting her metoprolol in half and taking half in the morning and half in the evening. If she still feels very fatigued then  decrease to half once a day. I gave her handwritten instructions for this. She is to followup in 4 weeks.

## 2012-11-05 LAB — CBC WITH DIFFERENTIAL/PLATELET
Basophils Absolute: 0 10*3/uL (ref 0.0–0.1)
Basophils Relative: 0 % (ref 0–1)
Eosinophils Relative: 5 % (ref 0–5)
Lymphocytes Relative: 24 % (ref 12–46)
MCHC: 34 g/dL (ref 30.0–36.0)
MCV: 92.4 fL (ref 78.0–100.0)
Monocytes Absolute: 0.6 10*3/uL (ref 0.1–1.0)
Neutro Abs: 3.2 10*3/uL (ref 1.7–7.7)
Platelets: 158 10*3/uL (ref 150–400)
RDW: 13.5 % (ref 11.5–15.5)
WBC: 5.4 10*3/uL (ref 4.0–10.5)

## 2012-11-05 LAB — COMPLETE METABOLIC PANEL WITH GFR
Albumin: 4.4 g/dL (ref 3.5–5.2)
BUN: 18 mg/dL (ref 6–23)
CO2: 28 mEq/L (ref 19–32)
Calcium: 9.5 mg/dL (ref 8.4–10.5)
Chloride: 102 mEq/L (ref 96–112)
Creat: 0.88 mg/dL (ref 0.50–1.10)
GFR, Est African American: 68 mL/min
Potassium: 4.3 mEq/L (ref 3.5–5.3)

## 2012-11-05 LAB — TSH: TSH: 0.521 u[IU]/mL (ref 0.350–4.500)

## 2012-11-05 LAB — CK TOTAL AND CKMB (NOT AT ARMC)

## 2012-11-07 ENCOUNTER — Encounter: Payer: Self-pay | Admitting: *Deleted

## 2012-11-08 NOTE — Telephone Encounter (Signed)
Pt said she doesn' want to go to Caridology.

## 2012-11-16 ENCOUNTER — Other Ambulatory Visit: Payer: Self-pay | Admitting: Family Medicine

## 2012-12-02 ENCOUNTER — Ambulatory Visit: Payer: Medicare Other | Admitting: Family Medicine

## 2012-12-21 ENCOUNTER — Other Ambulatory Visit: Payer: Self-pay | Admitting: Family Medicine

## 2012-12-23 DIAGNOSIS — R21 Rash and other nonspecific skin eruption: Secondary | ICD-10-CM | POA: Diagnosis not present

## 2013-01-27 ENCOUNTER — Other Ambulatory Visit: Payer: Self-pay | Admitting: Family Medicine

## 2013-02-03 DIAGNOSIS — R21 Rash and other nonspecific skin eruption: Secondary | ICD-10-CM | POA: Diagnosis not present

## 2013-02-06 ENCOUNTER — Emergency Department (INDEPENDENT_AMBULATORY_CARE_PROVIDER_SITE_OTHER)
Admission: EM | Admit: 2013-02-06 | Discharge: 2013-02-06 | Disposition: A | Discharge status: Home / Self Care | Payer: Medicare Other | Source: Home / Self Care | Attending: Emergency Medicine | Admitting: Emergency Medicine

## 2013-02-06 ENCOUNTER — Encounter: Payer: Self-pay | Admitting: *Deleted

## 2013-02-06 DIAGNOSIS — J069 Acute upper respiratory infection, unspecified: Secondary | ICD-10-CM

## 2013-02-06 DIAGNOSIS — J029 Acute pharyngitis, unspecified: Secondary | ICD-10-CM

## 2013-02-06 MED ORDER — AZITHROMYCIN 250 MG PO TABS: ORAL_TABLET | ORAL | Status: DC

## 2013-02-06 NOTE — ED Provider Notes (Signed)
History     CSN: [id number]  Arrival date & time 02/06/13  1711   First MD Initiated Contact with Patient 02/06/13 1715      Chief Complaint  Patient presents with  . Sore Throat  . Cough    (Consider location/radiation/quality/duration/timing/severity/associated sxs/prior treatment) HPI Brandy Tran is a 79 y.o. female who complains of onset of cold symptoms for 2 days.  The symptoms are constant and mild in severity.  She is taking Coricidin HBP and Tylenol which is helping a little bit.  Her main concern is the sore throat. + sore throat No cough No pleuritic pain No wheezing + nasal congestion + post-nasal drainage No sinus pain/pressure +/- chest congestion No itchy/red eyes No earache No hemoptysis +/- SOB (chronic) No chills/sweats No fever No nausea No vomiting No abdominal pain No diarrhea No skin rashes No fatigue No myalgias No headache    Past Medical History  Diagnosis Date  . Hiatal hernia   . Hypothyroid   . Angina pectoris   . GERD (gastroesophageal reflux disease)   . Esophageal stricture   . Aortic stenosis     Past Surgical History  Procedure Laterality Date  . Left hip replacement s/p fracture    . Abdominal hysterectomy  1968    partial  . Tonsillectomy    . Elbow surgery      Bilateral  . Cholecystectomy      Family History  Problem Relation Age of Onset  . Heart disease Mother     Unknown type    History  Substance Use Topics  . Smoking status: Never Smoker   . Smokeless tobacco: Not on file  . Alcohol Use: No    OB History   Grav Para Term Preterm Abortions TAB SAB Ect Mult Living                  Review of Systems  All other systems reviewed and are negative.    Allergies  Amoxicillin-pot clavulanate and Neomycin-polymyxin b gu  Home Medications   Current Outpatient Rx  Name  Route  Sig  Dispense  Refill  . amLODipine (NORVASC) 10 MG tablet   Oral   Take 1 tablet (10 mg total) by mouth daily.   30  tablet   12   . aspirin EC 81 MG tablet   Oral   Take 1 tablet (81 mg total) by mouth daily.   90 tablet   3   . azithromycin (ZITHROMAX Z-PAK) 250 MG tablet      Use as directed   1 each   0   . doxycycline (VIBRAMYCIN) 100 MG capsule   Oral   Take 1 capsule (100 mg total) by mouth 2 (two) times daily. For 10 days.   20 capsule   0   . famciclovir (FAMVIR) 500 MG tablet   Oral   Take 1 tablet (500 mg total) by mouth 3 (three) times daily. Fir 10 days.   30 tablet   0   . levothyroxine (SYNTHROID, LEVOTHROID) 100 MCG tablet      take 1 tablet by mouth once daily   90 tablet   2   . lidocaine (XYLOCAINE) 2 % jelly   Topical   Apply topically 2 (two) times daily. As needed.   30 mL   0   . metoprolol (TOPROL-XL) 200 MG 24 hr tablet      take 1 tablet by mouth once daily   90 tablet  0     Needs appointment   . nitroGLYCERIN (NITRODUR - DOSED IN MG/24 HR) 0.2 mg/hr      apply 1 patch once daily   30 patch   2   . traMADol (ULTRAM) 50 MG tablet   Oral   Take 1 tablet (50 mg total) by mouth every 8 (eight) hours as needed for pain.   20 tablet   0     BP 136/75  Pulse 76  Temp(Src) 98.1 F (36.7 C) (Oral)  Wt 189 lb (85.73 kg)  BMI 29.59 kg/m2  SpO2 94%  Physical Exam  Nursing note and vitals reviewed. Constitutional: She is oriented to person, place, and time. Vital signs are normal. She appears well-developed and well-nourished.  Non-toxic appearance. She does not have a sickly appearance.  HENT:  Head: Normocephalic and atraumatic.  Right Ear: Tympanic membrane, external ear and ear canal normal.  Left Ear: Tympanic membrane, external ear and ear canal normal.  Nose: Mucosal edema and rhinorrhea present.  Mouth/Throat: Posterior oropharyngeal erythema present. No oropharyngeal exudate or posterior oropharyngeal edema.  Eyes: No scleral icterus.  Neck: Neck supple.  Cardiovascular: Normal rate and regular rhythm.   Murmur heard.   Systolic murmur is present with a grade of 4/6  Pulmonary/Chest: Effort normal and breath sounds normal. No respiratory distress. She has no decreased breath sounds. She has no wheezes. She has no rhonchi.  Neurological: She is alert and oriented to person, place, and time.  Skin: Skin is warm and dry.  Psychiatric: She has a normal mood and affect. Her speech is normal.    ED Course  Procedures (including critical care time)  Labs Reviewed  POCT RAPID STREP A (OFFICE)   No results found.   1. Acute upper respiratory infections of unspecified site   2. Acute pharyngitis       MDM  1)  Take the prescribed antibiotic as instructed.  Rapid strep negative.  No culture. 2)  Use nasal saline solution (over the counter) at least 3 times a day. 3)  Can take tylenol every 6 hours for pain or fever. 4)  Follow up with your primary doctor if no improvement in 5-7 days, sooner if increasing pain, fever, or new symptoms.   I discussed with the patient the possibility of developing pneumonia, although I don't see any evidence currently.  Will start Z-pak.  Our nurse will call patient back in 2 days to check on her symptoms.  If not improving, may want to consider returning to the office for a recheck.  Marlaine Hind, MD 02/06/13 [phone]

## 2013-02-06 NOTE — ED Notes (Signed)
Pt c/o cough, sore throat, runny nose and chest congestion x 2 days. Denies fever. She has taken Coricidin HBP and tylenol.

## 2013-02-07 ENCOUNTER — Telehealth: Payer: Self-pay

## 2013-02-07 ENCOUNTER — Ambulatory Visit (INDEPENDENT_AMBULATORY_CARE_PROVIDER_SITE_OTHER): Payer: Medicare Other | Admitting: Family Medicine

## 2013-02-07 ENCOUNTER — Encounter: Payer: Self-pay | Admitting: Family Medicine

## 2013-02-07 VITALS — BP 145/80 | HR 85 | Temp 97.6°F | Wt 187.0 lb

## 2013-02-07 DIAGNOSIS — R05 Cough: Secondary | ICD-10-CM

## 2013-02-07 DIAGNOSIS — J029 Acute pharyngitis, unspecified: Secondary | ICD-10-CM | POA: Diagnosis not present

## 2013-02-07 MED ORDER — DOXYCYCLINE HYCLATE 100 MG PO TABS: ORAL_TABLET | ORAL | Status: AC

## 2013-02-07 MED ORDER — LIDOCAINE VISCOUS 2 % MT SOLN: OROMUCOSAL | Status: DC

## 2013-02-07 NOTE — Telephone Encounter (Signed)
Brandy Tran was seen at the urgent care last night for a sore throat and treated with a Z-pack. She states the Zpack is causing elevated heart rate. She wants to come in. Scheduled her with Dr Ivan Anchors today.

## 2013-02-07 NOTE — Progress Notes (Signed)
CC: Brandy Tran is a 79 y.o. female is here for elevated heart rate   Subjective: HPI:  Patient complains of rapid heartbeat. This started yesterday minutes after taking first dose of azithromycin. Lasted 2 hours. She denies irregular rhythm, complains more of rate.  Symptoms came on minutes after taking second dose of azithromycin today.  Lasted 2 hours, both episodes resolved without any particular intervention.  She gets mildly dizzy with a rapid heartbeat. She denies a cough and sore throat that's been going on for days for which she was prescribed these antibiotics. Denies any improvement sore throat or cough. Complains of shortness of breath that has been chronic, occurs with walking the length of a hallway, severity has not changed since onset of cough and sore throat.  She denies chest pain, flushing, jaw pain, diaphoresis, motor or sensory disturbances, nor arm pain.    Review Of Systems Outlined In HPI  Past Medical History  Diagnosis Date  . Hiatal hernia   . Hypothyroid   . Angina pectoris   . GERD (gastroesophageal reflux disease)   . Esophageal stricture   . Aortic stenosis      Family History  Problem Relation Age of Onset  . Heart disease Mother     Unknown type     History  Substance Use Topics  . Smoking status: Never Smoker   . Smokeless tobacco: Not on file  . Alcohol Use: No     Objective: Filed Vitals:   02/07/13 1151  BP: 145/80  Pulse: 85  Temp: 97.6 F (36.4 C)    General: Alert and Oriented, No Acute Distress HEENT: Pupils equal, round, reactive to light. Conjunctivae clear.  External ears unremarkable, canals clear with intact TMs with appropriate landmarks.  Middle ear appears open without effusion. Pink inferior turbinates.  Moist mucous membranes, posterior pharynx with moderate erythema and, midline uvula, no lesions in the pharynx..  Neck supple without palpable lymphadenopathy nor abnormal masses. Lungs: Clear to auscultation  bilaterally, no wheezing/ronchi/rales.  Comfortable work of breathing. Good air movement. Cardiac: Regular rate and rhythm. Normal S1/S2.  Holosystolic murmur grade 1/6 Extremities: No peripheral edema.  Strong peripheral pulses.  Mental Status: No depression, anxiety, nor agitation. Skin: Warm and dry.  Assessment & Plan: Tory was seen today for elevated heart rate.  Diagnoses and associated orders for this visit:  Sore throat - doxycycline (VIBRA-TABS) 100 MG tablet; One by mouth twice a day for ten days.  Cough - lidocaine (XYLOCAINE) 2 % solution; One table spoon swallowed every 6 hours as needed for sore throat.    She has tolerated doxycycline in the past, we will switch to this, stop azithromycin. Add lidocaine solution by mouth to use on an as-needed basis for sore throat. I've asked her to return for office spirometry once feeling better.  Return in about 4 weeks (around 03/07/2013) for PFTs.

## 2013-04-02 ENCOUNTER — Other Ambulatory Visit: Payer: Self-pay | Admitting: Family Medicine

## 2013-04-09 ENCOUNTER — Ambulatory Visit (INDEPENDENT_AMBULATORY_CARE_PROVIDER_SITE_OTHER): Payer: Medicare Other | Admitting: Cardiology

## 2013-04-09 ENCOUNTER — Encounter: Payer: Self-pay | Admitting: Cardiology

## 2013-04-09 VITALS — BP 140/80 | HR 69 | Wt 187.0 lb

## 2013-04-09 DIAGNOSIS — R079 Chest pain, unspecified: Secondary | ICD-10-CM

## 2013-04-09 DIAGNOSIS — I10 Essential (primary) hypertension: Secondary | ICD-10-CM | POA: Diagnosis not present

## 2013-04-09 DIAGNOSIS — R0609 Other forms of dyspnea: Secondary | ICD-10-CM | POA: Diagnosis not present

## 2013-04-09 DIAGNOSIS — R0989 Other specified symptoms and signs involving the circulatory and respiratory systems: Secondary | ICD-10-CM | POA: Diagnosis not present

## 2013-04-09 DIAGNOSIS — R0600 Dyspnea, unspecified: Secondary | ICD-10-CM

## 2013-04-09 NOTE — Patient Instructions (Addendum)
Your physician wants you to follow-up in: ONE YEAR WITH DR CRENSHAW You will receive a reminder letter in the mail two months in advance. If you don't receive a letter, please call our office to schedule the follow-up appointment.  

## 2013-04-09 NOTE — Assessment & Plan Note (Signed)
Blood pressure controlled. Continue present medications. 

## 2013-04-09 NOTE — Assessment & Plan Note (Signed)
No significant change in symptoms. LV function is normal. Her aortic valve appears to be opening well. She does have a mild LVOT gradient. Continue beta-blockade. We discussed screening for coronary disease previously but she does not want to pursue this as she would never agree to cardiac catheterization. She wants medical therapy only.

## 2013-04-09 NOTE — Progress Notes (Signed)
   HPI: Pleasant female for fu of dyspnea and palpitations. Laboratories in August of 2013 showed a normal TSH, normal potassium and normal hemoglobin. Echocardiogram in September of 2013 showed normal LV function, moderate left ventricular hypertrophy, grade 1 diastolic dysfunction, mild aortic insufficiency, mild mitral regurgitation, mild left atrial enlargement and a mildly elevated LVOT gradient of 15 mm of mercury. Patient wants only conservative measures. We discussed a functional study to screen for coronary disease but she declined as she would not be willing to pursue cardiac catheterization in the future. Patient apparently had a syncopal episode in December. Electrocardiogram shows sinus rhythm with left bundle branch block. Laboratories were unremarkable. I last saw her in Oct 2013. Since then, some dyspnea on exertion and mild pedal edema but no chest pain or syncope.   Current Outpatient Prescriptions  Medication Sig Dispense Refill  . amLODipine (NORVASC) 10 MG tablet Take 1 tablet (10 mg total) by mouth daily.  30 tablet  12  . levothyroxine (SYNTHROID, LEVOTHROID) 100 MCG tablet take 1 tablet by mouth once daily  90 tablet  2  . lidocaine (XYLOCAINE) 2 % solution One table spoon swallowed every 6 hours as needed for sore throat.  100 mL  0  . metoprolol (TOPROL-XL) 200 MG 24 hr tablet Take 100 mg by mouth daily.       . nitroGLYCERIN (NITRODUR - DOSED IN MG/24 HR) 0.2 mg/hr APPLY 1 PATCH ONCE DAILY AS DIRECTED  30 patch  2   No current facility-administered medications for this visit.     Past Medical History  Diagnosis Date  . Hiatal hernia   . Hypothyroid   . Angina pectoris   . GERD (gastroesophageal reflux disease)   . Esophageal stricture   . Aortic stenosis     Past Surgical History  Procedure Laterality Date  . Left hip replacement s/p fracture    . Abdominal hysterectomy  1968    partial  . Tonsillectomy    . Elbow surgery      Bilateral  . Cholecystectomy       History   Social History  . Marital Status: Widowed    Spouse Name: N/A    Number of Children: 2  . Years of Education: N/A   Occupational History  . Not on file.   Social History Main Topics  . Smoking status: Never Smoker   . Smokeless tobacco: Not on file  . Alcohol Use: No  . Drug Use: No  . Sexually Active: Not Currently   Other Topics Concern  . Not on file   Social History Narrative  . No narrative on file    ROS: no fevers or chills, productive cough, hemoptysis, dysphasia, odynophagia, melena, hematochezia, dysuria, hematuria, rash, seizure activity, orthopnea, PND, pedal edema, claudication. Remaining systems are negative.  Physical Exam: Well-developed well-nourished in no acute distress.  Skin is warm and dry.  HEENT is normal.  Neck is supple.  Chest is clear to auscultation with normal expansion.  Cardiovascular exam is regular rate and rhythm. 2/6 systolic murmur Abdominal exam nontender or distended. No masses palpated. Extremities show 1+ ankle edema. neuro grossly intact  ECG sinus rhythm with left bundle branch block.

## 2013-04-28 DIAGNOSIS — D485 Neoplasm of uncertain behavior of skin: Secondary | ICD-10-CM | POA: Diagnosis not present

## 2013-04-28 DIAGNOSIS — C4441 Basal cell carcinoma of skin of scalp and neck: Secondary | ICD-10-CM | POA: Diagnosis not present

## 2013-05-14 DIAGNOSIS — C4441 Basal cell carcinoma of skin of scalp and neck: Secondary | ICD-10-CM | POA: Diagnosis not present

## 2013-05-25 ENCOUNTER — Other Ambulatory Visit: Payer: Self-pay | Admitting: Family Medicine

## 2013-07-01 ENCOUNTER — Other Ambulatory Visit: Payer: Self-pay | Admitting: Family Medicine

## 2013-08-06 ENCOUNTER — Ambulatory Visit (INDEPENDENT_AMBULATORY_CARE_PROVIDER_SITE_OTHER): Payer: Medicare Other | Admitting: Family Medicine

## 2013-08-06 ENCOUNTER — Encounter: Payer: Self-pay | Admitting: Family Medicine

## 2013-08-06 VITALS — BP 127/68 | HR 77 | Wt 185.0 lb

## 2013-08-06 DIAGNOSIS — R5381 Other malaise: Secondary | ICD-10-CM

## 2013-08-06 DIAGNOSIS — L659 Nonscarring hair loss, unspecified: Secondary | ICD-10-CM

## 2013-08-06 LAB — COMPLETE METABOLIC PANEL WITH GFR
Alkaline Phosphatase: 80 U/L (ref 39–117)
Creat: 1.14 mg/dL — ABNORMAL HIGH (ref 0.50–1.10)
GFR, Est Non African American: 43 mL/min — ABNORMAL LOW
Glucose, Bld: 95 mg/dL (ref 70–99)
Sodium: 140 mEq/L (ref 135–145)
Total Bilirubin: 0.5 mg/dL (ref 0.3–1.2)
Total Protein: 7.1 g/dL (ref 6.0–8.3)

## 2013-08-06 LAB — CBC WITH DIFFERENTIAL/PLATELET
Basophils Absolute: 0 10*3/uL (ref 0.0–0.1)
Basophils Relative: 0 % (ref 0–1)
Eosinophils Relative: 2 % (ref 0–5)
Lymphocytes Relative: 21 % (ref 12–46)
MCV: 91.3 fL (ref 78.0–100.0)
Neutro Abs: 3.2 10*3/uL (ref 1.7–7.7)
Platelets: 195 10*3/uL (ref 150–400)
RDW: 13.4 % (ref 11.5–15.5)
WBC: 4.7 10*3/uL (ref 4.0–10.5)

## 2013-08-06 LAB — VITAMIN B12: Vitamin B-12: 752 pg/mL (ref 211–911)

## 2013-08-06 NOTE — Progress Notes (Signed)
  Subjective:    Patient ID: Brandy Tran, female    DOB: 1922/04/11, 79 y.o.   MRN: [id number]  HPI noticed large clumps of hair falling out x2 weeks no new shampoo or chemicals being used. she last had TSH done 11/04/12 it was 0.521. No rashes.  No new medications.  No lesions on the scalp.  Taking medicatoin regularly.    Review of Systems     Objective:   Physical Exam  Constitutional: She appears well-developed and well-nourished.  HENT:  Head: Normocephalic and atraumatic.  Skin: Skin is warm and dry.  She actually has thick hair.  No scalp lesions or rash. No concentrated areas of hair loss.  No exclamation hairs.   Psychiatric: She has a normal mood and affect. Her behavior is normal.          Assessment & Plan:  Abrupt hair loss - Unclear etiology.  No new shampoos or harsh chemicals or treatments, her. No new medications. Everything has been stable. Stress syndrome on the body can sometimes cause significant and abrupt hair loss. If she's not anything major recently. Her last thyroid level was in December we'll recheck that as well as recheck a CBC and a B12. She does have a history of B12 deficiency.

## 2013-08-07 ENCOUNTER — Ambulatory Visit: Payer: Medicare Other | Admitting: Family Medicine

## 2013-08-07 ENCOUNTER — Other Ambulatory Visit: Payer: Self-pay | Admitting: *Deleted

## 2013-08-07 DIAGNOSIS — R5381 Other malaise: Secondary | ICD-10-CM

## 2013-08-08 ENCOUNTER — Other Ambulatory Visit: Payer: Self-pay | Admitting: Family Medicine

## 2013-08-14 DIAGNOSIS — R5381 Other malaise: Secondary | ICD-10-CM | POA: Diagnosis not present

## 2013-08-15 ENCOUNTER — Other Ambulatory Visit: Payer: Self-pay | Admitting: Family Medicine

## 2013-08-15 DIAGNOSIS — N951 Menopausal and female climacteric states: Secondary | ICD-10-CM

## 2013-08-15 DIAGNOSIS — N179 Acute kidney failure, unspecified: Secondary | ICD-10-CM

## 2013-08-15 LAB — BASIC METABOLIC PANEL
BUN: 19 mg/dL (ref 6–23)
CO2: 29 mEq/L (ref 19–32)
Calcium: 9.1 mg/dL (ref 8.4–10.5)
Chloride: 101 mEq/L (ref 96–112)
Creat: 1.16 mg/dL — ABNORMAL HIGH (ref 0.50–1.10)
Glucose, Bld: 131 mg/dL — ABNORMAL HIGH (ref 70–99)

## 2013-08-17 DIAGNOSIS — Z9109 Other allergy status, other than to drugs and biological substances: Secondary | ICD-10-CM | POA: Diagnosis not present

## 2013-08-17 DIAGNOSIS — K439 Ventral hernia without obstruction or gangrene: Secondary | ICD-10-CM | POA: Diagnosis not present

## 2013-08-17 DIAGNOSIS — Z881 Allergy status to other antibiotic agents status: Secondary | ICD-10-CM | POA: Diagnosis not present

## 2013-08-17 DIAGNOSIS — N281 Cyst of kidney, acquired: Secondary | ICD-10-CM | POA: Diagnosis not present

## 2013-08-17 DIAGNOSIS — R6889 Other general symptoms and signs: Secondary | ICD-10-CM | POA: Diagnosis not present

## 2013-08-17 DIAGNOSIS — Z880 Allergy status to penicillin: Secondary | ICD-10-CM | POA: Diagnosis not present

## 2013-08-17 DIAGNOSIS — I209 Angina pectoris, unspecified: Secondary | ICD-10-CM | POA: Diagnosis not present

## 2013-08-17 DIAGNOSIS — M549 Dorsalgia, unspecified: Secondary | ICD-10-CM | POA: Diagnosis not present

## 2013-08-17 DIAGNOSIS — I10 Essential (primary) hypertension: Secondary | ICD-10-CM | POA: Diagnosis not present

## 2013-08-17 DIAGNOSIS — R7309 Other abnormal glucose: Secondary | ICD-10-CM | POA: Diagnosis not present

## 2013-08-17 DIAGNOSIS — E079 Disorder of thyroid, unspecified: Secondary | ICD-10-CM | POA: Diagnosis not present

## 2013-08-17 DIAGNOSIS — K5730 Diverticulosis of large intestine without perforation or abscess without bleeding: Secondary | ICD-10-CM | POA: Diagnosis not present

## 2013-08-24 DIAGNOSIS — R5381 Other malaise: Secondary | ICD-10-CM | POA: Diagnosis not present

## 2013-08-24 DIAGNOSIS — M549 Dorsalgia, unspecified: Secondary | ICD-10-CM | POA: Diagnosis not present

## 2013-08-24 DIAGNOSIS — T733XXA Exhaustion due to excessive exertion, initial encounter: Secondary | ICD-10-CM | POA: Diagnosis not present

## 2013-08-24 DIAGNOSIS — Z883 Allergy status to other anti-infective agents status: Secondary | ICD-10-CM | POA: Diagnosis not present

## 2013-08-24 DIAGNOSIS — Z79899 Other long term (current) drug therapy: Secondary | ICD-10-CM | POA: Diagnosis not present

## 2013-08-24 DIAGNOSIS — Z90710 Acquired absence of both cervix and uterus: Secondary | ICD-10-CM | POA: Diagnosis not present

## 2013-08-24 DIAGNOSIS — M545 Low back pain: Secondary | ICD-10-CM | POA: Diagnosis not present

## 2013-08-24 DIAGNOSIS — E039 Hypothyroidism, unspecified: Secondary | ICD-10-CM | POA: Diagnosis not present

## 2013-08-24 DIAGNOSIS — I209 Angina pectoris, unspecified: Secondary | ICD-10-CM | POA: Diagnosis not present

## 2013-08-24 DIAGNOSIS — Z9109 Other allergy status, other than to drugs and biological substances: Secondary | ICD-10-CM | POA: Diagnosis not present

## 2013-08-24 DIAGNOSIS — IMO0002 Reserved for concepts with insufficient information to code with codable children: Secondary | ICD-10-CM | POA: Diagnosis not present

## 2013-08-24 DIAGNOSIS — Z880 Allergy status to penicillin: Secondary | ICD-10-CM | POA: Diagnosis not present

## 2013-08-24 DIAGNOSIS — I10 Essential (primary) hypertension: Secondary | ICD-10-CM | POA: Diagnosis not present

## 2013-08-24 DIAGNOSIS — Z96649 Presence of unspecified artificial hip joint: Secondary | ICD-10-CM | POA: Diagnosis not present

## 2013-08-24 DIAGNOSIS — M8448XA Pathological fracture, other site, initial encounter for fracture: Secondary | ICD-10-CM | POA: Diagnosis not present

## 2013-08-25 DIAGNOSIS — M545 Low back pain: Secondary | ICD-10-CM | POA: Diagnosis not present

## 2013-08-25 DIAGNOSIS — K5900 Constipation, unspecified: Secondary | ICD-10-CM | POA: Diagnosis not present

## 2013-08-25 DIAGNOSIS — K469 Unspecified abdominal hernia without obstruction or gangrene: Secondary | ICD-10-CM | POA: Insufficient documentation

## 2013-08-25 DIAGNOSIS — E039 Hypothyroidism, unspecified: Secondary | ICD-10-CM | POA: Diagnosis not present

## 2013-08-25 DIAGNOSIS — I10 Essential (primary) hypertension: Secondary | ICD-10-CM | POA: Diagnosis not present

## 2013-08-26 DIAGNOSIS — K5900 Constipation, unspecified: Secondary | ICD-10-CM | POA: Diagnosis not present

## 2013-08-26 DIAGNOSIS — M8448XA Pathological fracture, other site, initial encounter for fracture: Secondary | ICD-10-CM | POA: Diagnosis not present

## 2013-08-26 DIAGNOSIS — IMO0002 Reserved for concepts with insufficient information to code with codable children: Secondary | ICD-10-CM | POA: Diagnosis not present

## 2013-08-26 DIAGNOSIS — M545 Low back pain: Secondary | ICD-10-CM | POA: Diagnosis not present

## 2013-08-26 DIAGNOSIS — M47817 Spondylosis without myelopathy or radiculopathy, lumbosacral region: Secondary | ICD-10-CM | POA: Diagnosis not present

## 2013-08-26 DIAGNOSIS — M5137 Other intervertebral disc degeneration, lumbosacral region: Secondary | ICD-10-CM | POA: Diagnosis not present

## 2013-08-26 DIAGNOSIS — I10 Essential (primary) hypertension: Secondary | ICD-10-CM | POA: Diagnosis not present

## 2013-08-27 DIAGNOSIS — S32009A Unspecified fracture of unspecified lumbar vertebra, initial encounter for closed fracture: Secondary | ICD-10-CM | POA: Diagnosis not present

## 2013-08-27 DIAGNOSIS — K5900 Constipation, unspecified: Secondary | ICD-10-CM | POA: Diagnosis not present

## 2013-08-27 DIAGNOSIS — E039 Hypothyroidism, unspecified: Secondary | ICD-10-CM | POA: Diagnosis not present

## 2013-08-27 DIAGNOSIS — I10 Essential (primary) hypertension: Secondary | ICD-10-CM | POA: Diagnosis not present

## 2013-08-28 DIAGNOSIS — S32009A Unspecified fracture of unspecified lumbar vertebra, initial encounter for closed fracture: Secondary | ICD-10-CM | POA: Diagnosis not present

## 2013-08-28 DIAGNOSIS — M545 Low back pain: Secondary | ICD-10-CM | POA: Diagnosis not present

## 2013-08-28 DIAGNOSIS — K5900 Constipation, unspecified: Secondary | ICD-10-CM | POA: Diagnosis not present

## 2013-08-28 DIAGNOSIS — E039 Hypothyroidism, unspecified: Secondary | ICD-10-CM | POA: Diagnosis not present

## 2013-08-29 DIAGNOSIS — M545 Low back pain: Secondary | ICD-10-CM | POA: Diagnosis not present

## 2013-08-29 DIAGNOSIS — S32009A Unspecified fracture of unspecified lumbar vertebra, initial encounter for closed fracture: Secondary | ICD-10-CM | POA: Diagnosis not present

## 2013-08-29 DIAGNOSIS — K5900 Constipation, unspecified: Secondary | ICD-10-CM | POA: Diagnosis not present

## 2013-08-29 DIAGNOSIS — E039 Hypothyroidism, unspecified: Secondary | ICD-10-CM | POA: Diagnosis not present

## 2013-08-30 DIAGNOSIS — M545 Low back pain: Secondary | ICD-10-CM | POA: Diagnosis not present

## 2013-08-30 DIAGNOSIS — S32009A Unspecified fracture of unspecified lumbar vertebra, initial encounter for closed fracture: Secondary | ICD-10-CM | POA: Diagnosis not present

## 2013-08-30 DIAGNOSIS — R5381 Other malaise: Secondary | ICD-10-CM | POA: Diagnosis not present

## 2013-08-30 DIAGNOSIS — K5900 Constipation, unspecified: Secondary | ICD-10-CM | POA: Diagnosis not present

## 2013-09-02 ENCOUNTER — Telehealth: Payer: Self-pay | Admitting: *Deleted

## 2013-09-02 DIAGNOSIS — IMO0001 Reserved for inherently not codable concepts without codable children: Secondary | ICD-10-CM | POA: Diagnosis not present

## 2013-09-02 DIAGNOSIS — M129 Arthropathy, unspecified: Secondary | ICD-10-CM | POA: Diagnosis not present

## 2013-09-02 DIAGNOSIS — IMO0002 Reserved for concepts with insufficient information to code with codable children: Secondary | ICD-10-CM | POA: Diagnosis not present

## 2013-09-02 DIAGNOSIS — M8448XD Pathological fracture, other site, subsequent encounter for fracture with routine healing: Secondary | ICD-10-CM | POA: Diagnosis not present

## 2013-09-02 DIAGNOSIS — R269 Unspecified abnormalities of gait and mobility: Secondary | ICD-10-CM | POA: Diagnosis not present

## 2013-09-02 NOTE — Telephone Encounter (Signed)
Jonny Ruiz, PT from [facility] is asking for a verbal authorization for a plan of care for the pt.  He states once you auth it, they will come up with the plan & fax to you for approval.

## 2013-09-02 NOTE — Telephone Encounter (Signed)
Okay for order for home health. Call patient for some verified that she is okay with [place] doing physical therapy. I do not want to waste her time if she's not interested.

## 2013-09-03 DIAGNOSIS — IMO0001 Reserved for inherently not codable concepts without codable children: Secondary | ICD-10-CM | POA: Diagnosis not present

## 2013-09-03 DIAGNOSIS — M8448XD Pathological fracture, other site, subsequent encounter for fracture with routine healing: Secondary | ICD-10-CM | POA: Diagnosis not present

## 2013-09-03 DIAGNOSIS — M129 Arthropathy, unspecified: Secondary | ICD-10-CM | POA: Diagnosis not present

## 2013-09-03 DIAGNOSIS — R269 Unspecified abnormalities of gait and mobility: Secondary | ICD-10-CM | POA: Diagnosis not present

## 2013-09-03 DIAGNOSIS — IMO0002 Reserved for concepts with insufficient information to code with codable children: Secondary | ICD-10-CM | POA: Diagnosis not present

## 2013-09-03 NOTE — Telephone Encounter (Signed)
John from [facility] notified.

## 2013-09-05 ENCOUNTER — Encounter: Payer: Self-pay | Admitting: Physician Assistant

## 2013-09-05 ENCOUNTER — Ambulatory Visit (INDEPENDENT_AMBULATORY_CARE_PROVIDER_SITE_OTHER): Payer: Medicare Other | Admitting: Physician Assistant

## 2013-09-05 VITALS — BP 137/73 | HR 81 | Wt 180.0 lb

## 2013-09-05 DIAGNOSIS — M545 Low back pain, unspecified: Secondary | ICD-10-CM

## 2013-09-05 DIAGNOSIS — M4856XS Collapsed vertebra, not elsewhere classified, lumbar region, sequela of fracture: Secondary | ICD-10-CM

## 2013-09-05 MED ORDER — CYCLOBENZAPRINE HCL 5 MG PO TABS: 5.0000 mg | ORAL_TABLET | Freq: Two times a day (BID) | ORAL | Status: DC | PRN

## 2013-09-05 MED ORDER — OXYCODONE-ACETAMINOPHEN 5-325 MG PO TABS: 1.0000 | ORAL_TABLET | ORAL | Status: DC | PRN

## 2013-09-05 MED ORDER — KETOROLAC TROMETHAMINE 60 MG/2ML IM SOLN
60.0000 mg | Freq: Once | INTRAMUSCULAR | Status: AC
  Administered 2013-09-05: 60 mg via INTRAMUSCULAR

## 2013-09-05 NOTE — Patient Instructions (Signed)
Back, Compression Fracture A compression fracture happens when a force is put upon the length of your spine. Slipping and falling on your bottom are examples of such a force. When this happens, sometimes the force is great enough to compress the building blocks (vertebral bodies) of your spine. Although this causes a lot of pain, this can usually be treated at home, unless your caregiver feels hospitalization is needed for pain control. Your backbone (spinal column) is made up of 24 main vertebral bodies in addition to the sacrum and coccyx (see illustration). These are held together by tough fibrous tissues (ligaments) and by support of your muscles. Nerve roots pass through the openings between the vertebrae. A sudden wrenching move, injury, or a fall may cause a compression fracture of one of the vertebral bodies. This may result in back pain or spread of pain into the belly (abdomen), the buttocks, and down the leg into the foot. Pain may also be created by muscle spasm alone. Large studies have been undertaken to determine the best possible course of action to help your back following injury and also to prevent future problems. The recommendations are as follows. FOLLOWING A COMPRESSION FRACTURE: Do the following only if advised by your caregiver.   If a back brace has been suggested or provided, wear it as directed.  When allowed to return to regular activities, avoid a sedentary life style. Actively exercise. Sporadic weekend binges of tennis, racquetball, water skiing, may actually aggravate or create problems, especially if you are not in condition for that activity.  Avoid sports requiring sudden body movements until you are in condition for them. Swimming and walking are safer activities.  Maintain good posture.  Avoid obesity.  If not already done, you should have a DEXA scan. Based on the results, be treated for osteoporosis. FOLLOWING ACUTE (SUDDEN) INJURY:  Only take  over-the-counter or prescription medicines for pain, discomfort, or fever as directed by your caregiver.  Use bed rest for only the most extreme acute episode. Prolonged bed rest may aggravate your condition. Ice used for acute conditions is effective. Use a large plastic bag filled with ice. Wrap it in a towel. This also provides excellent pain relief. This may be continuous. Or use it for 30 minutes every 2 hours during acute phase, then as needed. Heat for 30 minutes prior to activities is helpful.  As soon as the acute phase (the time when your back is too painful for you to do normal activities) is over, it is important to resume normal activities and work Arboriculturist. Back injuries can cause potentially marked changes in lifestyle. So it is important to attack these problems aggressively.  See your caregiver for continued problems. He or she can help or refer you for appropriate exercises, physical therapy and work hardening if needed.  If you are given narcotic medications for your condition, for the next 24 hours DO NOT:  Drive  Operate machinery or power tools.  Sign legal documents.  DO NOT drink alcohol, take sleeping pills or other medications that may interfere with treatment. If your caregiver has given you a follow-up appointment, it is very important to keep that appointment. Not keeping the appointment could result in a chronic or permanent injury, pain, and disability. If there is any problem keeping the appointment, you must call back to this facility for assistance.  SEEK IMMEDIATE MEDICAL CARE IF:  You develop numbness, tingling, weakness, or problems with the use of your arms or legs.  You develop severe back pain not relieved with medications.  You have changes in bowel or bladder control.  You have increasing pain in any areas of the body. Document Released: 10/23/2005 Document Revised: 01/15/2012 Document Reviewed: 05/27/2008 [facility] Patient Information  2014 [place], [place].

## 2013-09-05 NOTE — Progress Notes (Signed)
  Subjective:    Patient ID: Brandy Tran, female    DOB: 1922/04/22, 79 y.o.   MRN: [id number]  HPI Patient is an 79 year old female who presents to the clinic with her daughter. Patient's chief complaint today is 10 out of 10 low back pain with radiation to the left buttock. Patient was seen at [facility] on 10/12 and 10/19. First visit none of her pain was relieved with hydrocodone. She returned and was given Percocet. She has not been able to fill Percocet because apparently be physician did not put a dose or quanity and pharmacy would not fill. Patient has been in 10 out of 10 pain. Call movement causes pain in the lower back and down into left buttock. Patient denies any saddle anesthesia or bowel or bladder dysfunction. She does feel nauseated and was give zofran at ED. CBC/electrolytes were normal. CT scan did show a compression fracture at L2.  Review of Systems     Objective:   Physical Exam  Constitutional: She is oriented to person, place, and time. She appears distressed.  Severe kyphosis. Walking slowly with a walker.   HENT:  Head: Normocephalic and atraumatic.  Cardiovascular: Normal rate, regular rhythm and normal heart sounds.   Pulmonary/Chest: Effort normal and breath sounds normal. She has no wheezes.  Musculoskeletal:  Lack of ability to ambulate on her own due to pain. Walks with walker. Pain over L2 with palpation.   Neurological: She is alert and oriented to person, place, and time.  Psychiatric: She has a normal mood and affect. Her behavior is normal.          Assessment & Plan:  Compression fracture of lumbar spine at L2- gave shot of toradol 60mg  in office today. Sent patient home with 5 mg of Flexeril to use sparingly as needed for muscle relaxation. Pt has tolerated in past.  Patient and daughter are aware of sedation side effects. Since there is a concern of age and GI side effects I gave patient sample of flector patch to place  on left lower paraspinus muscles 12 hours then rest 12 hours. Pt was given percocet 5/325 to use as needed for pain. Continuing OT therapy. Gave pt HO on compression fracture.I do not think pt needs back brace due to little supportive evidence. Alternating heat and ice on back could help with symptoms.

## 2013-09-08 ENCOUNTER — Ambulatory Visit: Payer: Medicare Other | Admitting: Family Medicine

## 2013-09-08 DIAGNOSIS — IMO0001 Reserved for inherently not codable concepts without codable children: Secondary | ICD-10-CM | POA: Diagnosis not present

## 2013-09-08 DIAGNOSIS — M129 Arthropathy, unspecified: Secondary | ICD-10-CM | POA: Diagnosis not present

## 2013-09-08 DIAGNOSIS — IMO0002 Reserved for concepts with insufficient information to code with codable children: Secondary | ICD-10-CM | POA: Diagnosis not present

## 2013-09-08 DIAGNOSIS — M8448XD Pathological fracture, other site, subsequent encounter for fracture with routine healing: Secondary | ICD-10-CM | POA: Diagnosis not present

## 2013-09-08 DIAGNOSIS — R269 Unspecified abnormalities of gait and mobility: Secondary | ICD-10-CM | POA: Diagnosis not present

## 2013-09-09 ENCOUNTER — Ambulatory Visit (INDEPENDENT_AMBULATORY_CARE_PROVIDER_SITE_OTHER): Payer: Medicare Other | Admitting: Family Medicine

## 2013-09-09 ENCOUNTER — Encounter: Payer: Self-pay | Admitting: Family Medicine

## 2013-09-09 VITALS — BP 140/72 | HR 86 | Wt 181.0 lb

## 2013-09-09 DIAGNOSIS — R32 Unspecified urinary incontinence: Secondary | ICD-10-CM

## 2013-09-09 DIAGNOSIS — R21 Rash and other nonspecific skin eruption: Secondary | ICD-10-CM | POA: Diagnosis not present

## 2013-09-09 DIAGNOSIS — R319 Hematuria, unspecified: Secondary | ICD-10-CM | POA: Diagnosis not present

## 2013-09-09 DIAGNOSIS — K5900 Constipation, unspecified: Secondary | ICD-10-CM

## 2013-09-09 DIAGNOSIS — M545 Low back pain: Secondary | ICD-10-CM

## 2013-09-09 DIAGNOSIS — K5909 Other constipation: Secondary | ICD-10-CM

## 2013-09-09 LAB — POCT URINALYSIS DIPSTICK
Bilirubin, UA: NEGATIVE
Glucose, UA: NEGATIVE
Ketones, UA: NEGATIVE
Nitrite, UA: NEGATIVE
Protein, UA: 30
Spec Grav, UA: 1.015
pH, UA: 6

## 2013-09-09 MED ORDER — OXYCODONE-ACETAMINOPHEN 5-325 MG PO TABS: 1.0000 | ORAL_TABLET | ORAL | Status: DC | PRN

## 2013-09-09 MED ORDER — KETOROLAC TROMETHAMINE 60 MG/2ML IM SOLN
60.0000 mg | Freq: Once | INTRAMUSCULAR | Status: AC
  Administered 2013-09-09: 60 mg via INTRAMUSCULAR

## 2013-09-09 MED ORDER — CIPROFLOXACIN HCL 500 MG PO TABS: 500.0000 mg | ORAL_TABLET | Freq: Two times a day (BID) | ORAL | Status: AC

## 2013-09-09 NOTE — Progress Notes (Signed)
Subjective:    Patient ID: Brandy Tran, female    DOB: 1922/10/24, 79 y.o.   MRN: [id number]  HPI Having incontinence for about 1 year. Will get a normal urge to go but then stands up and bladder empties.  Some pelvic pain since hurt her back.  She is also having a lot of low back pain just below the SI joint on the left side. She wonders if it could be from her hip replacement. She says most of her pain is in that location. It is tender to touch as well. She uses miralax daily but sometime will go 1-2 weeks inbetween BM.  Changing position aggravates her symptoms. There's no real alleviating position. The pain is constant. She rates her pain a 10 out of 10 in her back today.  Vertebral compression fracture- she is almost out of her pain medication. She does feel like oxycodone has been more helpful than the hydrocodone but is still in significant pain. She rates her pain a 10 out of 10 today. Though she says today is a little worse than yesterday. She's also feeling nauseated the last couple days. She's also having problems moving her bowels though she does report having a good bowel movement yesterday. But also says she's been going 1-2 weeks between bowel movements lately. She does use 2 tablespoons MiraLax daily.  Rash left inner leg x 2-3 days. Stings. No trauma.  No other rash or lesions.  Not using any treatments. She's not remember coming in contact with anything that should have caused a rash. No other lesions on the skin.     Review of Systems  BP 140/72  Pulse 86  Wt 181 lb (82.101 kg)    Allergies  Allergen Reactions  . Amoxicillin-Pot Clavulanate     REACTION: Vomit  . Neomycin-Polymyxin B Gu     REACTION: Rash    Past Medical History  Diagnosis Date  . Hiatal hernia   . Hypothyroid   . Angina pectoris   . GERD (gastroesophageal reflux disease)   . Esophageal stricture   . Aortic stenosis     Past Surgical History  Procedure Laterality Date  . Left hip  replacement s/p fracture    . Abdominal hysterectomy  1968    partial  . Tonsillectomy    . Elbow surgery      Bilateral  . Cholecystectomy      History   Social History  . Marital Status: Widowed    Spouse Name: N/A    Number of Children: 2  . Years of Education: N/A   Occupational History  . Not on file.   Social History Main Topics  . Smoking status: Never Smoker   . Smokeless tobacco: Not on file  . Alcohol Use: No  . Drug Use: No  . Sexual Activity: Not Currently   Other Topics Concern  . Not on file   Social History Narrative  . No narrative on file    Family History  Problem Relation Age of Onset  . Heart disease Mother     Unknown type    Outpatient Encounter Prescriptions as of 09/09/2013  Medication Sig  . amLODipine (NORVASC) 10 MG tablet Take 1 tablet (10 mg total) by mouth daily.  . cyclobenzaprine (FLEXERIL) 5 MG tablet Take 1 tablet (5 mg total) by mouth 2 (two) times daily as needed for muscle spasms.  Marland Kitchen levothyroxine (SYNTHROID, LEVOTHROID) 100 MCG tablet take 1 tablet by mouth once daily  . metoprolol (  TOPROL-XL) 200 MG 24 hr tablet take 1 tablet by mouth once daily  . nitroGLYCERIN (NITRODUR - DOSED IN MG/24 HR) 0.2 mg/hr patch apply 1 patch once daily  . oxyCODONE-acetaminophen (PERCOCET/ROXICET) 5-325 MG per tablet Take 1 tablet by mouth every 4 (four) hours as needed for severe pain.  . [DISCONTINUED] oxyCODONE-acetaminophen (PERCOCET/ROXICET) 5-325 MG per tablet Take 1 tablet by mouth every 4 (four) hours as needed for pain.  . ciprofloxacin (CIPRO) 500 MG tablet Take 1 tablet (500 mg total) by mouth 2 (two) times daily.  . [DISCONTINUED] traMADol (ULTRAM) 50 MG tablet Take 50 mg by mouth every 6 (six) hours as needed for pain.  . [EXPIRED] ketorolac (TORADOL) injection 60 mg           Objective:   Physical Exam  Constitutional: She appears well-developed and well-nourished.  HENT:  Head: Normocephalic and atraumatic.  Abdominal:  She exhibits no distension. There is no guarding.  Lower abdomen feels very full and firm. Unable to feel a lot of stool in the colon. She also has a abdominal hernia that protrudes in the right lower quadrant.  Musculoskeletal:  She has significant difficulty going from sitting to standing and needs assistance. She is using a walker today. She's had significant pain of the left lower back just below the SI joint. Mildly tender over the SI joint.  Skin:  Erythematous rash that is oval on left inner leg above the ankle that is approx 1.5 x 2 cm          Assessment & Plan:  Incontinence-it sounds like she's having urgency incontinence. We will check a urinalysis for UTI. We'll send for culture. I did go ahead and put her on an antibiotic for possible UTI since she has been having some pelvic pain over the last couple weeks as well. If the culture comes back negative we cannot stop the antibiotic. I also think that her constipation to be making the incontinence worse as well. Also consider further evaluation with pelvic exam or possible medication treatment. Incontinence is certainly increases risk for falls.  Vertebral compression fracture-will refill Percocet today. Like to see her back in 2 weeks. We did discuss potentially getting a back brace for more support if she would like. She wasn't sure about that. She can certainly think about it. I did not recommend vertebroplasty. Given Toradol 60 mg injection today for acute pain relief as she didn't find this helpful last week.  Rash-KOH sent for further evaluation. If negative for fungal elements will treat with topical steroid cream.  Constipation - increase MiraLax to twice a day for at least the next 4-5 days until bowel movements are coming more normally. She still having post constipation and please call us back and we can add a stimulant laxative if needed.

## 2013-09-09 NOTE — Patient Instructions (Signed)
Increase miralax to twice a day.

## 2013-09-10 ENCOUNTER — Other Ambulatory Visit: Payer: Self-pay | Admitting: Family Medicine

## 2013-09-10 DIAGNOSIS — IMO0002 Reserved for concepts with insufficient information to code with codable children: Secondary | ICD-10-CM | POA: Diagnosis not present

## 2013-09-10 DIAGNOSIS — M129 Arthropathy, unspecified: Secondary | ICD-10-CM | POA: Diagnosis not present

## 2013-09-10 DIAGNOSIS — IMO0001 Reserved for inherently not codable concepts without codable children: Secondary | ICD-10-CM | POA: Diagnosis not present

## 2013-09-10 DIAGNOSIS — M8448XD Pathological fracture, other site, subsequent encounter for fracture with routine healing: Secondary | ICD-10-CM | POA: Diagnosis not present

## 2013-09-10 DIAGNOSIS — R269 Unspecified abnormalities of gait and mobility: Secondary | ICD-10-CM | POA: Diagnosis not present

## 2013-09-10 LAB — URINE CULTURE: Colony Count: 30000

## 2013-09-10 MED ORDER — TRIAMCINOLONE ACETONIDE 0.1 % EX OINT: 1.0000 "application " | TOPICAL_OINTMENT | Freq: Every day | CUTANEOUS | Status: DC

## 2013-09-12 ENCOUNTER — Encounter: Payer: Self-pay | Admitting: *Deleted

## 2013-09-12 ENCOUNTER — Telehealth: Payer: Self-pay | Admitting: *Deleted

## 2013-09-12 DIAGNOSIS — M8448XD Pathological fracture, other site, subsequent encounter for fracture with routine healing: Secondary | ICD-10-CM | POA: Diagnosis not present

## 2013-09-12 DIAGNOSIS — R269 Unspecified abnormalities of gait and mobility: Secondary | ICD-10-CM | POA: Diagnosis not present

## 2013-09-12 DIAGNOSIS — IMO0001 Reserved for inherently not codable concepts without codable children: Secondary | ICD-10-CM | POA: Diagnosis not present

## 2013-09-12 DIAGNOSIS — M129 Arthropathy, unspecified: Secondary | ICD-10-CM | POA: Diagnosis not present

## 2013-09-12 DIAGNOSIS — IMO0002 Reserved for concepts with insufficient information to code with codable children: Secondary | ICD-10-CM | POA: Diagnosis not present

## 2013-09-12 NOTE — Telephone Encounter (Signed)
Pt's daughter called and stated that when the pt was d/c'd from the hospital the doctor told her that her mother would require someone to stay with her around the clock, she is willing to do this however, the apartment complex that her mother resides in does not allow overnight guest. She was told by the manager that if she has a letter stating that her mother will need this type of care they will allow her to stay with her. She is asking if this can be ready by Tuesday so she can come pick it up.Loralee Pacas [place]

## 2013-09-15 DIAGNOSIS — M129 Arthropathy, unspecified: Secondary | ICD-10-CM | POA: Diagnosis not present

## 2013-09-15 DIAGNOSIS — R269 Unspecified abnormalities of gait and mobility: Secondary | ICD-10-CM | POA: Diagnosis not present

## 2013-09-15 DIAGNOSIS — IMO0002 Reserved for concepts with insufficient information to code with codable children: Secondary | ICD-10-CM | POA: Diagnosis not present

## 2013-09-15 DIAGNOSIS — IMO0001 Reserved for inherently not codable concepts without codable children: Secondary | ICD-10-CM | POA: Diagnosis not present

## 2013-09-15 DIAGNOSIS — M8448XD Pathological fracture, other site, subsequent encounter for fracture with routine healing: Secondary | ICD-10-CM | POA: Diagnosis not present

## 2013-09-17 DIAGNOSIS — R269 Unspecified abnormalities of gait and mobility: Secondary | ICD-10-CM | POA: Diagnosis not present

## 2013-09-17 DIAGNOSIS — IMO0001 Reserved for inherently not codable concepts without codable children: Secondary | ICD-10-CM | POA: Diagnosis not present

## 2013-09-17 DIAGNOSIS — IMO0002 Reserved for concepts with insufficient information to code with codable children: Secondary | ICD-10-CM | POA: Diagnosis not present

## 2013-09-17 DIAGNOSIS — M8448XD Pathological fracture, other site, subsequent encounter for fracture with routine healing: Secondary | ICD-10-CM | POA: Diagnosis not present

## 2013-09-17 DIAGNOSIS — M129 Arthropathy, unspecified: Secondary | ICD-10-CM | POA: Diagnosis not present

## 2013-09-18 DIAGNOSIS — M8448XD Pathological fracture, other site, subsequent encounter for fracture with routine healing: Secondary | ICD-10-CM | POA: Diagnosis not present

## 2013-09-18 DIAGNOSIS — M129 Arthropathy, unspecified: Secondary | ICD-10-CM | POA: Diagnosis not present

## 2013-09-18 DIAGNOSIS — IMO0001 Reserved for inherently not codable concepts without codable children: Secondary | ICD-10-CM | POA: Diagnosis not present

## 2013-09-18 DIAGNOSIS — R269 Unspecified abnormalities of gait and mobility: Secondary | ICD-10-CM | POA: Diagnosis not present

## 2013-09-18 DIAGNOSIS — IMO0002 Reserved for concepts with insufficient information to code with codable children: Secondary | ICD-10-CM | POA: Diagnosis not present

## 2013-09-23 ENCOUNTER — Ambulatory Visit (INDEPENDENT_AMBULATORY_CARE_PROVIDER_SITE_OTHER): Payer: Medicare Other | Admitting: Family Medicine

## 2013-09-23 ENCOUNTER — Encounter: Payer: Self-pay | Admitting: Family Medicine

## 2013-09-23 VITALS — BP 158/75 | HR 78 | Wt 184.0 lb

## 2013-09-23 DIAGNOSIS — R32 Unspecified urinary incontinence: Secondary | ICD-10-CM | POA: Diagnosis not present

## 2013-09-23 DIAGNOSIS — M8448XD Pathological fracture, other site, subsequent encounter for fracture with routine healing: Secondary | ICD-10-CM | POA: Diagnosis not present

## 2013-09-23 DIAGNOSIS — IMO0002 Reserved for concepts with insufficient information to code with codable children: Secondary | ICD-10-CM | POA: Diagnosis not present

## 2013-09-23 DIAGNOSIS — I10 Essential (primary) hypertension: Secondary | ICD-10-CM | POA: Diagnosis not present

## 2013-09-23 DIAGNOSIS — M129 Arthropathy, unspecified: Secondary | ICD-10-CM | POA: Diagnosis not present

## 2013-09-23 DIAGNOSIS — IMO0001 Reserved for inherently not codable concepts without codable children: Secondary | ICD-10-CM | POA: Diagnosis not present

## 2013-09-23 DIAGNOSIS — R269 Unspecified abnormalities of gait and mobility: Secondary | ICD-10-CM | POA: Diagnosis not present

## 2013-09-23 DIAGNOSIS — R1011 Right upper quadrant pain: Secondary | ICD-10-CM

## 2013-09-23 MED ORDER — SOLIFENACIN SUCCINATE 5 MG PO TABS: 5.0000 mg | ORAL_TABLET | Freq: Every day | ORAL | Status: DC

## 2013-09-23 MED ORDER — METRONIDAZOLE 500 MG PO TABS: 500.0000 mg | ORAL_TABLET | Freq: Two times a day (BID) | ORAL | Status: DC

## 2013-09-23 MED ORDER — AMLODIPINE BESYLATE 10 MG PO TABS: 10.0000 mg | ORAL_TABLET | Freq: Every day | ORAL | Status: DC

## 2013-09-23 NOTE — Patient Instructions (Signed)
Cut her amlodipine in half. Try this for 2 weeks and see if her swelling is better. If it does not go ahead and go back up to a whole tab. Which are able to start using her compression stockings again and please do so. 1-2 on an antibiotic called metronidazole for your abdominal pain. If you're not significantly better by Friday then please give me a call and going to set you up for a CT scan of the abdomen. Try the VESIcare 5mg  samples once a day for your bladder. Take in the morning.

## 2013-09-23 NOTE — Progress Notes (Signed)
Subjective:    Patient ID: Brandy Tran, female    DOB: March 05, 1922, 79 y.o.   MRN: [id number]  HPI Having pain on the right side between the shoulder blade and the spine. Stil lsome soreness near the compression fracture but better overall. Still using Percocet every 6 hours.  She has cut back on the dosing regimen.   Still having urinary frequency and urgency.  Having urinary accidents mostly at night.    Legs are still swollen in the AM . Hasn't been able to wear the compression stocking since she fractured her back.  Says swelling doesn't really get much better overnight. .    Pain over the right upper quandrant x 1-2 weeks.  No nausea with it. . Pain is nonstop. Hurts worse with BM. BM helps reliever her pain and pressure. Having BM twice a day.  Not straining.  No fever, chills or sweats.  She says her constipation is actually dull but better. Increase the dose on the MiraLax and has been helpful.   Review of Systems BP 158/75  Pulse 78  Wt 184 lb (83.462 kg)    Allergies  Allergen Reactions  . Amoxicillin-Pot Clavulanate     REACTION: Vomit  . Neomycin-Polymyxin B Gu     REACTION: Rash    Past Medical History  Diagnosis Date  . Hiatal hernia   . Hypothyroid   . Angina pectoris   . GERD (gastroesophageal reflux disease)   . Esophageal stricture   . Aortic stenosis     Past Surgical History  Procedure Laterality Date  . Left hip replacement s/p fracture    . Abdominal hysterectomy  1968    partial  . Tonsillectomy    . Elbow surgery      Bilateral  . Cholecystectomy      History   Social History  . Marital Status: Widowed    Spouse Name: N/A    Number of Children: 2  . Years of Education: N/A   Occupational History  . Not on file.   Social History Main Topics  . Smoking status: Never Smoker   . Smokeless tobacco: Not on file  . Alcohol Use: No  . Drug Use: No  . Sexual Activity: Not Currently   Other Topics Concern  . Not on file   Social  History Narrative  . No narrative on file    Family History  Problem Relation Age of Onset  . Heart disease Mother     Unknown type    Outpatient Encounter Prescriptions as of 09/23/2013  Medication Sig  . amLODipine (NORVASC) 10 MG tablet Take 1 tablet (10 mg total) by mouth daily.  . cyclobenzaprine (FLEXERIL) 5 MG tablet Take 1 tablet (5 mg total) by mouth 2 (two) times daily as needed for muscle spasms.  Marland Kitchen levothyroxine (SYNTHROID, LEVOTHROID) 100 MCG tablet take 1 tablet by mouth once daily  . metoprolol (TOPROL-XL) 200 MG 24 hr tablet take 1 tablet by mouth once daily  . nitroGLYCERIN (NITRODUR - DOSED IN MG/24 HR) 0.2 mg/hr patch apply 1 patch once daily  . oxyCODONE-acetaminophen (PERCOCET/ROXICET) 5-325 MG per tablet Take 1 tablet by mouth every 4 (four) hours as needed for severe pain.  Marland Kitchen triamcinolone ointment (KENALOG) 0.1 % Apply 1 application topically daily.  . [DISCONTINUED] amLODipine (NORVASC) 10 MG tablet Take 1 tablet (10 mg total) by mouth daily.  . metroNIDAZOLE (FLAGYL) 500 MG tablet Take 1 tablet (500 mg total) by mouth 2 (two) times daily.  Marland Kitchen  solifenacin (VESICARE) 5 MG tablet Take 1 tablet (5 mg total) by mouth daily.          Objective:   Physical Exam  Constitutional: She is oriented to person, place, and time. She appears well-developed and well-nourished.  HENT:  Head: Normocephalic and atraumatic.  Cardiovascular: Normal rate, regular rhythm and normal heart sounds.   Pulmonary/Chest: Effort normal and breath sounds normal.  Abdominal: Soft. There is tenderness.  TTP in the LUQ near the umbilicus. Has a large abdominal hernia   Musculoskeletal: She exhibits edema.  2+ pitting edema in both LE up to the knees.   Neurological: She is alert and oriented to person, place, and time.  Skin: Skin is warm and dry.  Psychiatric: She has a normal mood and affect. Her behavior is normal.          Assessment & Plan:  Compression fracture-  right  now her pain is fairly well-controlled on her current regimen. Continue with the Percocet as tolerated. She does not need any refills right now.  Lower extremity edema-I would like for her to try cutting back her amlodipine to half a tab for the next 2 weeks to see if she feels like her swelling is improved. It sounds most consistent with lymphedema since it does not really improve that much with elevation and overnight. She is also not been able to wear compression stockings for the last several weeks because of her back pain. Hopefully as she is feeling better she can start to wear them again and this will help as well.  RUQ pain - she's very tender on exam today. Since increasing the MiraLax she has had more frequent bowel movements and this has not relieved her pain. Though she does feel better after a bowel movement. She has had a history of colitis a couple years ago. It like to put her on a course of Flagyl. If she's not feeling significantly better by Friday then I want her to call me back and we'll schedule her for CT scan of the abdomen.  Acute renal failure-I had asked her to get a renal ultrasound. They called to schedule but she did not feel well so she called and canceled it.  Urinary urgency/incontinence-I. would like her to try samples of VESIcare for 2-3 weeks to see if she feels like it's helpful or not. She is not excited about taking another pill that he can discontinue very helpful for her. Her urine culture was negative for infection. Will give her samples of VESIcare 5 mg daily. #21 tabs.

## 2013-09-25 DIAGNOSIS — IMO0001 Reserved for inherently not codable concepts without codable children: Secondary | ICD-10-CM | POA: Diagnosis not present

## 2013-09-25 DIAGNOSIS — IMO0002 Reserved for concepts with insufficient information to code with codable children: Secondary | ICD-10-CM | POA: Diagnosis not present

## 2013-09-25 DIAGNOSIS — M8448XD Pathological fracture, other site, subsequent encounter for fracture with routine healing: Secondary | ICD-10-CM | POA: Diagnosis not present

## 2013-09-25 DIAGNOSIS — R269 Unspecified abnormalities of gait and mobility: Secondary | ICD-10-CM | POA: Diagnosis not present

## 2013-09-25 DIAGNOSIS — M129 Arthropathy, unspecified: Secondary | ICD-10-CM | POA: Diagnosis not present

## 2013-09-30 ENCOUNTER — Other Ambulatory Visit: Payer: Self-pay | Admitting: Family Medicine

## 2013-10-06 ENCOUNTER — Other Ambulatory Visit: Payer: Self-pay | Admitting: Family Medicine

## 2013-10-14 ENCOUNTER — Encounter: Payer: Self-pay | Admitting: Family Medicine

## 2013-10-14 ENCOUNTER — Ambulatory Visit (INDEPENDENT_AMBULATORY_CARE_PROVIDER_SITE_OTHER): Payer: Medicare Other | Admitting: Family Medicine

## 2013-10-14 VITALS — BP 132/65 | HR 79 | Wt 177.0 lb

## 2013-10-14 DIAGNOSIS — M8448XD Pathological fracture, other site, subsequent encounter for fracture with routine healing: Secondary | ICD-10-CM

## 2013-10-14 DIAGNOSIS — R32 Unspecified urinary incontinence: Secondary | ICD-10-CM | POA: Diagnosis not present

## 2013-10-14 DIAGNOSIS — IMO0002 Reserved for concepts with insufficient information to code with codable children: Secondary | ICD-10-CM

## 2013-10-14 MED ORDER — SOLIFENACIN SUCCINATE 5 MG PO TABS: 5.0000 mg | ORAL_TABLET | Freq: Every day | ORAL | Status: DC

## 2013-10-14 NOTE — Progress Notes (Signed)
   Subjective:    Patient ID: Brandy Tran, female    DOB: 08-02-1922, 79 y.o.   MRN: [id number]  HPI Compression fracture - followup compression fracture today. Overall she is improving. She says she still has significant back pain and it throbs. She feels exhausted. She's unable to cook for herself or do a lot of things for herself. Her daughter has actually been staying with her. Because she's in government housing they won't let herself or short period time. She think she is at the point of needing some type of assisted living or nursing home care. She has been able to just use Tylenol the last 2 days without having to use the Percocet. She does not want a refill on the medication at this point.  Urinary incontinence-she feels the VESIcare has been very helpful with her bladder. She would like to continue it. She has not had any negative side effects.  Review of Systems     Objective:   Physical Exam  Constitutional: She is oriented to person, place, and time. She appears well-developed and well-nourished.  HENT:  Head: Normocephalic and atraumatic.  Eyes: Conjunctivae and EOM are normal.  Cardiovascular: Normal rate.   Pulmonary/Chest: Effort normal.  Neurological: She is alert and oriented to person, place, and time.  Skin: Skin is dry. No pallor.  Psychiatric: She has a normal mood and affect. Her behavior is normal.          Assessment & Plan:  Compressions fracture- slowly healing. I do think her pain levels have decreased somewhat. He may take another month or 2 for her to get back to her baseline. Continue to use Tylenol as needed and she can call for a refill on the oxycodone if needed. She has still had some persistent nausea but hopefully that'll improve as she is no longer taking the oxycodone.   Urinary incontinence-improved with VESIcare. Samples given again today and perception sent to pharmacy. They may require that she try a generic first but we will see.  Wants  to be in a nursing home. Discussed with her daughter that she can certainly go around to different locations in visit and see if may have some bed availability. They typically will do an initial evaluation to see if she qualifies if she does then they will send me a form: FL2 to complete.  Acute renal failure-I had asked her to get a renal ultrasound. They called to schedule but she did not feel well so she called and canceled it. She wants to wait until January to have this done. I think it's reasonable.

## 2013-10-15 ENCOUNTER — Other Ambulatory Visit: Payer: Self-pay | Admitting: *Deleted

## 2013-10-15 MED ORDER — HYDROCODONE-ACETAMINOPHEN 5-325 MG PO TABS: ORAL_TABLET | ORAL | Status: DC

## 2013-10-15 NOTE — Telephone Encounter (Signed)
RX for Hydrocodone given to pt per Dr. Linford Arnold. Daughter requesting lower dose pain medication for pt.  Meyer Cory, LPN

## 2013-10-16 ENCOUNTER — Telehealth: Payer: Self-pay | Admitting: *Deleted

## 2013-10-16 MED ORDER — TRAMADOL HCL 50 MG PO TABS: 50.0000 mg | ORAL_TABLET | Freq: Three times a day (TID) | ORAL | Status: DC | PRN

## 2013-10-16 NOTE — Telephone Encounter (Signed)
Pt left message asking for rx for something not as strong as the hydrocodone.  She said that upsets her stomach.

## 2013-10-16 NOTE — Telephone Encounter (Signed)
Okay, will send her a prescription for tramadol. It's a weaker pain medication

## 2013-10-16 NOTE — Telephone Encounter (Signed)
Left detailed message on vm.

## 2013-10-23 ENCOUNTER — Ambulatory Visit (INDEPENDENT_AMBULATORY_CARE_PROVIDER_SITE_OTHER): Payer: Medicare Other

## 2013-10-23 DIAGNOSIS — Q619 Cystic kidney disease, unspecified: Secondary | ICD-10-CM

## 2013-10-23 DIAGNOSIS — N179 Acute kidney failure, unspecified: Secondary | ICD-10-CM | POA: Diagnosis not present

## 2013-11-09 ENCOUNTER — Other Ambulatory Visit: Payer: Self-pay | Admitting: Family Medicine

## 2013-12-09 ENCOUNTER — Other Ambulatory Visit: Payer: Self-pay | Admitting: Family Medicine

## 2013-12-09 ENCOUNTER — Ambulatory Visit: Payer: Medicare Other | Admitting: Family Medicine

## 2013-12-10 ENCOUNTER — Other Ambulatory Visit: Payer: Self-pay | Admitting: Family Medicine

## 2014-01-15 ENCOUNTER — Other Ambulatory Visit: Payer: Self-pay | Admitting: Family Medicine

## 2014-02-12 ENCOUNTER — Other Ambulatory Visit: Payer: Self-pay | Admitting: Family Medicine

## 2014-02-12 ENCOUNTER — Other Ambulatory Visit: Payer: Self-pay | Admitting: *Deleted

## 2014-02-12 MED ORDER — NITROGLYCERIN 0.2 MG/HR TD PT24: MEDICATED_PATCH | TRANSDERMAL | Status: DC

## 2014-03-10 ENCOUNTER — Other Ambulatory Visit: Payer: Self-pay | Admitting: Family Medicine

## 2014-03-24 ENCOUNTER — Other Ambulatory Visit: Payer: Self-pay | Admitting: Family Medicine

## 2014-04-18 ENCOUNTER — Other Ambulatory Visit: Payer: Self-pay | Admitting: Family Medicine

## 2014-04-20 ENCOUNTER — Other Ambulatory Visit: Payer: Self-pay | Admitting: Family Medicine

## 2014-04-20 NOTE — Telephone Encounter (Signed)
Needs f/u appt before future refills

## 2014-05-11 ENCOUNTER — Other Ambulatory Visit: Payer: Self-pay | Admitting: Family Medicine

## 2014-05-18 ENCOUNTER — Ambulatory Visit: Payer: Medicare Other | Admitting: Family Medicine

## 2014-05-22 ENCOUNTER — Ambulatory Visit (INDEPENDENT_AMBULATORY_CARE_PROVIDER_SITE_OTHER): Payer: Medicare Other | Admitting: Family Medicine

## 2014-05-22 ENCOUNTER — Encounter: Payer: Self-pay | Admitting: Family Medicine

## 2014-05-22 VITALS — BP 141/71 | HR 65 | Temp 98.5°F | Wt 177.0 lb

## 2014-05-22 DIAGNOSIS — N289 Disorder of kidney and ureter, unspecified: Secondary | ICD-10-CM | POA: Diagnosis not present

## 2014-05-22 DIAGNOSIS — M549 Dorsalgia, unspecified: Secondary | ICD-10-CM

## 2014-05-22 DIAGNOSIS — R600 Localized edema: Secondary | ICD-10-CM

## 2014-05-22 DIAGNOSIS — I10 Essential (primary) hypertension: Secondary | ICD-10-CM

## 2014-05-22 DIAGNOSIS — E039 Hypothyroidism, unspecified: Secondary | ICD-10-CM | POA: Diagnosis not present

## 2014-05-22 DIAGNOSIS — H6120 Impacted cerumen, unspecified ear: Secondary | ICD-10-CM

## 2014-05-22 DIAGNOSIS — H6121 Impacted cerumen, right ear: Secondary | ICD-10-CM

## 2014-05-22 DIAGNOSIS — R609 Edema, unspecified: Secondary | ICD-10-CM | POA: Diagnosis not present

## 2014-05-22 DIAGNOSIS — G8929 Other chronic pain: Secondary | ICD-10-CM | POA: Insufficient documentation

## 2014-05-22 LAB — TSH: TSH: 0.24 u[IU]/mL — AB (ref 0.350–4.500)

## 2014-05-22 LAB — COMPLETE METABOLIC PANEL WITH GFR
ALK PHOS: 77 U/L (ref 39–117)
ALT: 9 U/L (ref 0–35)
AST: 18 U/L (ref 0–37)
Albumin: 4 g/dL (ref 3.5–5.2)
BILIRUBIN TOTAL: 0.5 mg/dL (ref 0.2–1.2)
BUN: 22 mg/dL (ref 6–23)
CO2: 29 mEq/L (ref 19–32)
CREATININE: 0.95 mg/dL (ref 0.50–1.10)
Calcium: 8.9 mg/dL (ref 8.4–10.5)
Chloride: 101 mEq/L (ref 96–112)
GFR, Est African American: 61 mL/min
GFR, Est Non African American: 53 mL/min — ABNORMAL LOW
Glucose, Bld: 87 mg/dL (ref 70–99)
Potassium: 4.4 mEq/L (ref 3.5–5.3)
Sodium: 138 mEq/L (ref 135–145)
Total Protein: 6.8 g/dL (ref 6.0–8.3)

## 2014-05-22 MED ORDER — HYDROCHLOROTHIAZIDE 12.5 MG PO TABS: 12.5000 mg | ORAL_TABLET | Freq: Every day | ORAL | Status: DC

## 2014-05-22 NOTE — Progress Notes (Signed)
   Subjective:    Patient ID: Brandy Tran, female    DOB: Dec 09, 1921, 80 y.o.   MRN: [id number]  HPI Bilateral ear itching adn feels not heaing as well out of them.  She says no drainage. No fever.  Back pain- Still having pain at her compression fracture in the thoracid spine and now radiating up into her neck. She is fairly sedentary. She does not do any exercises or stretches. She's not using any heat pad or ice. She takes 2 Tylenol PM at bedtime and that does seem to help. She did try the tramadol because it made her feel funny in the head.  Hypertension- Pt denies chest pain, SOB, dizziness, or heart palpitations.  Taking meds as directed w/o problems.  Denies medication side effects.    Hypothyroidism-no change in mood or energy level. No skin or hair changes. Taking medications regularly.  Incontinence-she actually rarely uses the VESIcare. She read the side effect profile on the drug and is scared to take it. She currently wears adult diapers.  Review of Systems     Objective:   Physical Exam  Constitutional: She is oriented to person, place, and time. She appears well-developed and well-nourished.  HENT:  Head: Normocephalic and atraumatic.  Right canal blocked by cerumen.  Cardiovascular: Normal rate, regular rhythm and normal heart sounds.   Pulmonary/Chest: Effort normal and breath sounds normal.  Neurological: She is alert and oriented to person, place, and time.  Skin: Skin is warm and dry.  Psychiatric: She has a normal mood and affect. Her behavior is normal.          Assessment & Plan:  HTN - well controlled on current regimen. Amlodipine could be contributing to lower extremity swelling. We'll continue to monitor  Lower extremity edema-most likely related to venous stasis. The she also is on a high dose of amlodipine which certainly could be contributing. I wanted her on low-dose hydrochlorothiazide we will need to monitor her BUN and creatinine carefully.  I'll see her back in one month to make sure that she's tolerating it well. We'll recheck kidney function today. She had he has had an ultrasound  Hypothyroidism-due to recheck TSH.  Cerumen impaction, right ear-irrigation performed today. Patient tolerated well.  Incontinence-it's completely up to her whether not she wants to use the VESIcare. When she was taking it about 7 months ago it was working well but it sounds like she's nervous about side effects. Ultimately that's up to her.  Chronic back pain-Discussed the importance of exercises and stretches. Recommend using a heating pad. Also increase Tylenol dosing. Recommend one extra strength tablet in the morning, one after lunch, and 2 at bedtime. I think this would get her better 24-hour control of her pain.

## 2014-05-25 ENCOUNTER — Encounter: Payer: Self-pay | Admitting: Family Medicine

## 2014-05-25 ENCOUNTER — Other Ambulatory Visit: Payer: Self-pay | Admitting: Family Medicine

## 2014-05-25 DIAGNOSIS — N183 Chronic kidney disease, stage 3 unspecified: Secondary | ICD-10-CM | POA: Insufficient documentation

## 2014-05-25 MED ORDER — LEVOTHYROXINE SODIUM 88 MCG PO TABS: ORAL_TABLET | ORAL | Status: DC

## 2014-06-07 ENCOUNTER — Other Ambulatory Visit: Payer: Self-pay | Admitting: Family Medicine

## 2014-06-18 ENCOUNTER — Ambulatory Visit: Payer: Medicare Other | Admitting: Family Medicine

## 2014-06-29 ENCOUNTER — Ambulatory Visit (INDEPENDENT_AMBULATORY_CARE_PROVIDER_SITE_OTHER): Payer: Medicare Other | Admitting: Family Medicine

## 2014-06-29 ENCOUNTER — Encounter: Payer: Self-pay | Admitting: Family Medicine

## 2014-06-29 VITALS — BP 117/69 | HR 75 | Wt 168.0 lb

## 2014-06-29 DIAGNOSIS — Z23 Encounter for immunization: Secondary | ICD-10-CM | POA: Diagnosis not present

## 2014-06-29 DIAGNOSIS — R609 Edema, unspecified: Secondary | ICD-10-CM | POA: Diagnosis not present

## 2014-06-29 DIAGNOSIS — R600 Localized edema: Secondary | ICD-10-CM

## 2014-06-29 DIAGNOSIS — I10 Essential (primary) hypertension: Secondary | ICD-10-CM | POA: Diagnosis not present

## 2014-06-29 DIAGNOSIS — E039 Hypothyroidism, unspecified: Secondary | ICD-10-CM

## 2014-06-29 NOTE — Progress Notes (Signed)
   Subjective:    Patient ID: Brandy Tran, female    DOB: 19-Feb-1922, 80 y.o.   MRN: [id number]  Hypertension   Hypertension/lower extremity edema- Pt denies chest pain, SOB, dizziness, or heart palpitations.  Taking meds as directed w/o problems.  Denies medication side effects.  We added a low dose of hydrochlorothiazide to her regimen to help with some lower extremity swelling that she was experiencing. Unclear if the edema was coming from her amlodipine or from venous stasis. She's had pain problems throughout most of her adult life and has had treatments and does wear compression stockings regularly.  Hypothyroid - we adjusted dose to 43mcg daily. Due to recheck  Levels. She has not noticed any difference in how she feels on many different dose. No change in skin or hair or fatigue or energy levels. Review of Systems     Objective:   Physical Exam  Constitutional: She is oriented to person, place, and time. She appears well-developed and well-nourished.  HENT:  Head: Normocephalic and atraumatic.  Cardiovascular: Normal rate, regular rhythm and normal heart sounds.   Pulmonary/Chest: Effort normal and breath sounds normal.  Musculoskeletal:  1+ pitting edema around the ankles. Trace pitting edema above this.  Neurological: She is alert and oriented to person, place, and time.  Skin: Skin is warm and dry.  Psychiatric: She has a normal mood and affect. Her behavior is normal.          Assessment & Plan:  HTN- well controlled. Continue current regimen. We'll check BMP since she is not on a diuretic 4 weeks. Followup in 4 months.  LE edema  - much improved on diuretic.  Check BMP today.   Can decrease to every other day, and see if tolerates well and continues to keep her swelling under control. Continue to wear compression stockings daily..    Hypothyroid - due to recheck TSH. Can adjust as needed.we will look great and then we can recheck again in 4 months at her followup  appointment.  Flu shot given today.

## 2014-06-30 LAB — TSH: TSH: 0.725 u[IU]/mL (ref 0.350–4.500)

## 2014-06-30 LAB — BASIC METABOLIC PANEL WITH GFR
BUN: 18 mg/dL (ref 6–23)
CALCIUM: 9.3 mg/dL (ref 8.4–10.5)
CO2: 30 mEq/L (ref 19–32)
CREATININE: 1.01 mg/dL (ref 0.50–1.10)
Chloride: 97 mEq/L (ref 96–112)
GFR, Est African American: 57 mL/min — ABNORMAL LOW
GFR, Est Non African American: 49 mL/min — ABNORMAL LOW
Glucose, Bld: 98 mg/dL (ref 70–99)
Potassium: 4.7 mEq/L (ref 3.5–5.3)
Sodium: 137 mEq/L (ref 135–145)

## 2014-07-12 ENCOUNTER — Other Ambulatory Visit: Payer: Self-pay | Admitting: Family Medicine

## 2014-07-14 ENCOUNTER — Other Ambulatory Visit: Payer: Self-pay | Admitting: *Deleted

## 2014-07-14 MED ORDER — NITROGLYCERIN 0.2 MG/HR TD PT24: MEDICATED_PATCH | TRANSDERMAL | Status: DC

## 2014-08-04 ENCOUNTER — Encounter: Payer: Self-pay | Admitting: Family Medicine

## 2014-08-04 ENCOUNTER — Ambulatory Visit (INDEPENDENT_AMBULATORY_CARE_PROVIDER_SITE_OTHER): Payer: Medicare Other

## 2014-08-04 ENCOUNTER — Ambulatory Visit (INDEPENDENT_AMBULATORY_CARE_PROVIDER_SITE_OTHER): Payer: Medicare Other | Admitting: Family Medicine

## 2014-08-04 VITALS — BP 119/66 | HR 70 | Wt 171.0 lb

## 2014-08-04 DIAGNOSIS — M545 Low back pain, unspecified: Secondary | ICD-10-CM

## 2014-08-04 DIAGNOSIS — S32009A Unspecified fracture of unspecified lumbar vertebra, initial encounter for closed fracture: Secondary | ICD-10-CM | POA: Diagnosis not present

## 2014-08-04 DIAGNOSIS — G8929 Other chronic pain: Secondary | ICD-10-CM

## 2014-08-04 DIAGNOSIS — M7989 Other specified soft tissue disorders: Secondary | ICD-10-CM

## 2014-08-04 DIAGNOSIS — IMO0002 Reserved for concepts with insufficient information to code with codable children: Secondary | ICD-10-CM | POA: Diagnosis not present

## 2014-08-04 MED ORDER — DULOXETINE HCL 20 MG PO CPEP: 20.0000 mg | ORAL_CAPSULE | Freq: Every day | ORAL | Status: DC

## 2014-08-04 MED ORDER — TRAMADOL HCL 50 MG PO TABS: 25.0000 mg | ORAL_TABLET | Freq: Three times a day (TID) | ORAL | Status: DC | PRN

## 2014-08-04 MED ORDER — NITROGLYCERIN 0.2 MG/HR TD PT24: MEDICATED_PATCH | TRANSDERMAL | Status: DC

## 2014-08-04 NOTE — Progress Notes (Signed)
   Subjective:    Patient ID: Brandy Tran, female    DOB: 15-Feb-1922, 80 y.o.   MRN: [id number]  HPI Here for followup for chronic low back pain. It is bilateral and predominantly over the lumbar scar her waist. No radiation into her legs at this time. We last discussed this in June. At that time we had discussed increasing her Tylenol arthritis to 4 tabs daily. Encouraged her to work on stretching and using a heating pad. Did have lumbar x-rays in 2012. Says the tylenol doesn't work. She says after about 5 minutes of standing her pain gets worse and she has to sit down and rest.  Constipation- using miralax daily.  Says working well.  Swelling - we started her on HCTZ a little over a month ago. She's been taking it consistently without any side effects or problems. For her ankles have actually looks significantly better. She wants and if she should continue it or not. We did check a BMP about 4 weeks ago and kidney function and potassium are stable. Review of Systems     Objective:   Physical Exam  Constitutional: She is oriented to person, place, and time. She appears well-developed and well-nourished.  HENT:  Head: Normocephalic and atraumatic.  Musculoskeletal:  She still has 1+ ankle edema bilaterally but overall looks much better. She is wearing her compression stockings today.  Neurological: She is alert and oriented to person, place, and time.  Skin: Skin is warm and dry. No rash noted.  Psychiatric: She has a normal mood and affect. Her behavior is normal.          Assessment & Plan:  Discussed need for Prevnar 13 vaccine. Pneumococcal 23 was reportedly done in 2003.  Chronic back pain-discussed options. She really wants to stay away from narcotics as much as possible. She feels a little too sedated even with trazodone. We discussed different options including gabapentin or Cymbalta. I recommend starting a trial of Cymbalta first this is typically a little less sedating  than the gabapentin. We'll start with 20 mg which admittedly is a little bit of a low dose but with her age and rather be cautious and increase the medication slightly. She will need followup in 4-6 weeks to adjust her regimen. Call if any problems or side effects. In the meantime I did go ahead and give her prescription tramadol to use as needed I recommended she start with half of a tab. Continue to try to avoid narcotics if possible. I also recommend repeat x-ray since her last film has been a while. Just to make sure that nothing has changed since it sounds like her pain is gradually getting worse.  Lower extremity edema-the HCTZ 12.5 mg is working well. We'll repeat her BMP in about one month just make sure that kidney function potassium are stable. She is getting significant symptomatic relief of her edema her blood pressure is a little bit lower but she's not feeling lightheaded or dizzy so we'll continue to monitor that carefully.

## 2014-08-05 ENCOUNTER — Encounter: Payer: Self-pay | Admitting: Family Medicine

## 2014-08-09 ENCOUNTER — Other Ambulatory Visit: Payer: Self-pay | Admitting: Family Medicine

## 2014-09-01 ENCOUNTER — Encounter: Payer: Self-pay | Admitting: Family Medicine

## 2014-09-01 ENCOUNTER — Ambulatory Visit (INDEPENDENT_AMBULATORY_CARE_PROVIDER_SITE_OTHER): Payer: Medicare Other | Admitting: Family Medicine

## 2014-09-01 VITALS — BP 127/66 | HR 70 | Wt 171.0 lb

## 2014-09-01 DIAGNOSIS — M545 Low back pain: Secondary | ICD-10-CM

## 2014-09-01 DIAGNOSIS — R609 Edema, unspecified: Secondary | ICD-10-CM | POA: Diagnosis not present

## 2014-09-01 MED ORDER — AMLODIPINE BESYLATE 2.5 MG PO TABS: 2.5000 mg | ORAL_TABLET | Freq: Every day | ORAL | Status: DC

## 2014-09-01 NOTE — Progress Notes (Signed)
   Subjective:    Patient ID: Brandy Tran, female    DOB: 01-Nov-1922, 80 y.o.   MRN: [id number]  Back Pain   One-month follow-up for back pain-when I last saw her we decided to start her on Cymbalta for chronic back pain. She really wanted to stay away from any narcotics etc. which are extremely sedating for her. But wanted something that she could take daily to help her with her baseline pain. She decided not to start the medication and has not taken it. As far as her tramadol is concerned she has only taken 2 doses since I last saw her a month ago. She said she's not interested in taking something daily at this time.  She does complain of lightheadedness and dizziness today. She says that when she stands up she really can't stand up for more than 5 minutes without feeling like she is going to pass out. She's arteries flowed extremely weak but feels like she has to sit down after a few minutes of standing. She feels like it's less problematic with walking more if she just stands up and stays in the same position for a couple of minutes. She has not actually passed out or fallen. Her daughter is here with her today for the office visit. She also noted that her heart starts to race when she stands up and can last for a few minutes. It also increases the intensity of her back pain and wonders if that could be making her feel lightheaded. Apically get heart racing at rest.   Review of Systems  Musculoskeletal: Positive for back pain.       Objective:   Physical Exam  Constitutional: She is oriented to person, place, and time. She appears well-developed and well-nourished.  HENT:  Head: Normocephalic and atraumatic.  Cardiovascular: Normal rate, regular rhythm and normal heart sounds.   Pulmonary/Chest: Effort normal and breath sounds normal.  Musculoskeletal:  Trace ankle edema bilaterally  Neurological: She is alert and oriented to person, place, and time.  Skin: Skin is warm and dry.   Psychiatric: She has a normal mood and affect. Her behavior is normal.          Assessment & Plan:  Chronic low back pain after fracture-we'll continue with just tramadol as needed. Still consider the Cymbalta in the future if her back pain becomes more persistent or bothersome.  Postural hypotension-she had a significant drop in blood pressure when she stood up. And even after a few minutes her blood pressure had only increased by about 10 points. I suspect this is why she is getting lightheaded when she stands up. When I look back her blood pressures have been well controlled for quite some time. I'm going to decrease her amlodipine from 10 mg down to 2.5 mg and see if this seems to make a difference and if she is able to stand for longer periods of time. Also she is quite weak and deconditioned and this could be contributing. She still continues to have rapid heart rate then please let me know.

## 2014-09-02 LAB — BASIC METABOLIC PANEL WITH GFR

## 2014-09-03 ENCOUNTER — Telehealth: Payer: Self-pay | Admitting: Family Medicine

## 2014-09-03 NOTE — Telephone Encounter (Signed)
[facility] labs called today to inform Dr. Madilyn Fireman that the [facility] labs could not be run due to a contamination with the vial of blood.  I ask the lab if they would call the patient and she stated No.  I called and spoke with Molli Posey, our [facility] lab rep and explained to her what happened and she is going to call the patient and have someone go out and draw the patient at her home.

## 2014-09-29 ENCOUNTER — Ambulatory Visit (INDEPENDENT_AMBULATORY_CARE_PROVIDER_SITE_OTHER): Payer: Medicare Other | Admitting: Family Medicine

## 2014-09-29 ENCOUNTER — Encounter: Payer: Self-pay | Admitting: Family Medicine

## 2014-09-29 VITALS — BP 122/86 | HR 62 | Temp 97.7°F | Wt 170.0 lb

## 2014-09-29 DIAGNOSIS — I951 Orthostatic hypotension: Secondary | ICD-10-CM | POA: Diagnosis not present

## 2014-09-29 DIAGNOSIS — I10 Essential (primary) hypertension: Secondary | ICD-10-CM

## 2014-09-29 DIAGNOSIS — E039 Hypothyroidism, unspecified: Secondary | ICD-10-CM

## 2014-09-29 NOTE — Progress Notes (Signed)
   Subjective:    Patient ID: Brandy Tran, female    DOB: May 30, 1922, 80 y.o.   MRN: [id number]  HPI Follow-up postural hypotension-she had positive orthostatics at her last office visit he complained about feeling lightheaded after standing up. We decreased her amlodipine from 10 mg down to 2.5 mg.she's actually feeling much better since decreasing the amlodipine.  Chornic back pain -overall she's doing really well. She just uses her stronger pain medication maybe once or twice per week. In fact she feels that her back in a little bit better over the last couple of weeks.   Review of Systems     Objective:   Physical Exam  Constitutional: She is oriented to person, place, and time. She appears well-developed and well-nourished.  HENT:  Head: Normocephalic and atraumatic.  Neck: Neck supple. No thyromegaly present.  Cardiovascular: Normal rate, regular rhythm and normal heart sounds.   Pulmonary/Chest: Effort normal and breath sounds normal.  Lymphadenopathy:    She has no cervical adenopathy.  Neurological: She is alert and oriented to person, place, and time.  Skin: Skin is warm and dry.  Psychiatric: She has a normal mood and affect. Her behavior is normal.          Assessment & Plan:  Postural hypotension-she's actually feeling much better since decreasing the amlodipine. We'll continue current regimen. Follow-up in 3 months. Will check a CMP today.  Hypothyroidism-due to recheck thyroid level.  Chronic back pain-overall stable.

## 2014-09-30 LAB — COMPLETE METABOLIC PANEL WITH GFR
AST: 18 U/L (ref 0–37)
Albumin: 3.8 g/dL (ref 3.5–5.2)
Alkaline Phosphatase: 76 U/L (ref 39–117)
BUN: 15 mg/dL (ref 6–23)
CO2: 30 mEq/L (ref 19–32)
CREATININE: 0.88 mg/dL (ref 0.50–1.10)
Calcium: 9.3 mg/dL (ref 8.4–10.5)
Chloride: 99 mEq/L (ref 96–112)
GFR, EST AFRICAN AMERICAN: 67 mL/min
GFR, Est Non African American: 58 mL/min — ABNORMAL LOW
Glucose, Bld: 93 mg/dL (ref 70–99)
Potassium: 4.4 mEq/L (ref 3.5–5.3)
Sodium: 139 mEq/L (ref 135–145)
Total Bilirubin: 0.6 mg/dL (ref 0.2–1.2)
Total Protein: 6.6 g/dL (ref 6.0–8.3)

## 2014-09-30 LAB — TSH: TSH: 1.026 u[IU]/mL (ref 0.350–4.500)

## 2014-09-30 NOTE — Progress Notes (Signed)
Quick Note:  All labs are normal. ______ 

## 2014-10-19 ENCOUNTER — Ambulatory Visit: Payer: Medicare Other | Admitting: Family Medicine

## 2014-10-19 ENCOUNTER — Other Ambulatory Visit: Payer: Self-pay

## 2014-10-19 MED ORDER — METOPROLOL SUCCINATE ER 100 MG PO TB24: 100.0000 mg | ORAL_TABLET | Freq: Every day | ORAL | Status: DC

## 2014-11-04 ENCOUNTER — Other Ambulatory Visit: Payer: Self-pay | Admitting: Family Medicine

## 2014-11-10 ENCOUNTER — Other Ambulatory Visit: Payer: Self-pay | Admitting: Family Medicine

## 2014-12-31 ENCOUNTER — Ambulatory Visit: Payer: Medicare Other | Admitting: Family Medicine

## 2015-01-04 ENCOUNTER — Encounter: Payer: Self-pay | Admitting: Family Medicine

## 2015-01-04 ENCOUNTER — Ambulatory Visit (INDEPENDENT_AMBULATORY_CARE_PROVIDER_SITE_OTHER): Payer: Medicare Other | Admitting: Family Medicine

## 2015-01-04 VITALS — BP 150/76 | HR 63 | Wt 171.0 lb

## 2015-01-04 DIAGNOSIS — M545 Low back pain, unspecified: Secondary | ICD-10-CM

## 2015-01-04 DIAGNOSIS — I10 Essential (primary) hypertension: Secondary | ICD-10-CM | POA: Diagnosis not present

## 2015-01-04 MED ORDER — LISINOPRIL 10 MG PO TABS: 10.0000 mg | ORAL_TABLET | Freq: Every day | ORAL | Status: DC

## 2015-01-04 MED ORDER — TRAMADOL HCL 50 MG PO TABS: 25.0000 mg | ORAL_TABLET | Freq: Three times a day (TID) | ORAL | Status: DC | PRN

## 2015-01-04 NOTE — Progress Notes (Signed)
   Subjective:    Patient ID: Brandy Tran, female    DOB: 1922-07-13, 81 y.o.   MRN: [id number]  HPI Occ back pain - doing much better with it. Would like a refill on the tramadol.  Overall she is feeling much better.  HTN - we had decreased her amlodipine to 2.5 mg because she was orthostatic. She has felt much better on the lower dose.  Review of Systems     Objective:   Physical Exam  Constitutional: She is oriented to person, place, and time. She appears well-developed and well-nourished.  HENT:  Head: Normocephalic and atraumatic.  Cardiovascular: Normal rate, regular rhythm and normal heart sounds.   Pulmonary/Chest: Effort normal and breath sounds normal.  Neurological: She is alert and oriented to person, place, and time.  Skin: Skin is warm and dry.  Psychiatric: She has a normal mood and affect. Her behavior is normal.          Assessment & Plan:  Back pain-doing much better overall. Did refill tramadol today.  Hypertension-her blood pressure is elevated since we had to decrease her amlodipine. Will add 10 mg of lisinopril. She will check a BMP in one week to valuate kidney function as well as potassium level. Otherwise I will see her back in 6 weeks. If she would like to get her on home blood pressure cuff encouraged her to get an arm cuff and to bring it with her at her next office visits that we can make sure it's our curette. That way she can return it to the pharmacy if it does not work.

## 2015-01-19 DIAGNOSIS — I10 Essential (primary) hypertension: Secondary | ICD-10-CM | POA: Diagnosis not present

## 2015-01-20 LAB — BASIC METABOLIC PANEL WITH GFR
BUN: 20 mg/dL (ref 6–23)
CHLORIDE: 100 meq/L (ref 96–112)
CO2: 32 meq/L (ref 19–32)
Calcium: 9.1 mg/dL (ref 8.4–10.5)
Creat: 0.97 mg/dL (ref 0.50–1.10)
GFR, EST NON AFRICAN AMERICAN: 52 mL/min — AB
GFR, Est African American: 59 mL/min — ABNORMAL LOW
Glucose, Bld: 88 mg/dL (ref 70–99)
POTASSIUM: 5.1 meq/L (ref 3.5–5.3)
SODIUM: 139 meq/L (ref 135–145)

## 2015-02-02 ENCOUNTER — Other Ambulatory Visit: Payer: Self-pay | Admitting: Family Medicine

## 2015-02-02 NOTE — Telephone Encounter (Signed)
F/u around 02/15/2015

## 2015-02-04 ENCOUNTER — Telehealth: Payer: Self-pay | Admitting: *Deleted

## 2015-02-04 NOTE — Telephone Encounter (Signed)
Pt's daughter called and stated that her mother stopped taking the lisinopril x 2 days because it caused her kidneys to hurt. No blood in her urine, or fevers, only pressure. She said that her bp's have been the same even off of the meds. Will fwd to pcp for advice.Audelia Hives [place]

## 2015-02-05 MED ORDER — LOSARTAN POTASSIUM 25 MG PO TABS: 25.0000 mg | ORAL_TABLET | Freq: Every day | ORAL | Status: DC

## 2015-02-05 NOTE — Telephone Encounter (Signed)
Less just verify that she is taking the amlodipine and the HCTZ and the metoprolol. We can try adding losartan.

## 2015-02-09 ENCOUNTER — Other Ambulatory Visit: Payer: Self-pay | Admitting: Family Medicine

## 2015-02-10 NOTE — Telephone Encounter (Signed)
Left a message for a return call.

## 2015-02-12 NOTE — Telephone Encounter (Signed)
Contact Pt daughter, left message advising of recommendation. Verbalized understanding. No further questions.

## 2015-02-12 NOTE — Telephone Encounter (Signed)
Call pt: we actually have a higher normal BP with age. 150/90 is considered hypertension. Those numbers are fine. Certainly keep checking them. I think she is fine to wait until 4/12 appt. If numbers above 150/90 then can contact us sooner.

## 2015-02-12 NOTE — Telephone Encounter (Signed)
Spoke with Pt's daughter,Jenny, was informed the Pt is not taking her Losartan or HCTZ. Daughter states she has been taking Pt's BP at home and she has been running in the mid 080'E systolic and 23-36 diastolic. Pt has upcoming appt on 02/16/15 and daughter is OK with waiting until then to get medications adjusted. Advised I would route to a Provider in office today for review, and advised if the Pt's BP starts to run high over the weekend to take her to the ED. Verbalized understanding. No further questions at this time.

## 2015-02-16 ENCOUNTER — Encounter: Payer: Self-pay | Admitting: Family Medicine

## 2015-02-16 ENCOUNTER — Ambulatory Visit (INDEPENDENT_AMBULATORY_CARE_PROVIDER_SITE_OTHER): Payer: Medicare Other | Admitting: Family Medicine

## 2015-02-16 VITALS — BP 128/82 | HR 69 | Ht 67.0 in | Wt 171.0 lb

## 2015-02-16 DIAGNOSIS — N23 Unspecified renal colic: Secondary | ICD-10-CM

## 2015-02-16 DIAGNOSIS — R319 Hematuria, unspecified: Secondary | ICD-10-CM | POA: Diagnosis not present

## 2015-02-16 LAB — POCT URINALYSIS DIPSTICK
Bilirubin, UA: NEGATIVE
Glucose, UA: NEGATIVE
Ketones, UA: NEGATIVE
NITRITE UA: NEGATIVE
PH UA: 5.5
Spec Grav, UA: 1.025
UROBILINOGEN UA: 0.2

## 2015-02-16 NOTE — Progress Notes (Signed)
   Subjective:    Patient ID: Brandy Tran, female    DOB: 1922/10/22, 81 y.o.   MRN: [id number]  HPI Patients here today for 6 week blood pressure follow-up. When I last saw her we decided to start lisinopril. She felt like it was making her kidneys hurt and call the office. We discontinued the medication and sent in a prescription for losartan. She is not currently taking that and has not been taking the hydrochlorothiazide. They have been monitoring blood pressures at home and they have been running anywhere from 741 systolic of 638. Today her blood pressure was 116/64 home. Just uses her HCTZ prn for fluids.      Review of Systems     Objective:   Physical Exam  Constitutional: She is oriented to person, place, and time. She appears well-developed and well-nourished.  HENT:  Head: Normocephalic and atraumatic.  Cardiovascular: Normal rate and regular rhythm.   Murmur heard. 2/6 SEM best heard at the right sternal border   Pulmonary/Chest: Effort normal and breath sounds normal.  Neurological: She is alert and oriented to person, place, and time.  Skin: Skin is warm and dry.  Psychiatric: She has a normal mood and affect. Her behavior is normal.        Assessment & Plan:  HTN - blood pressures have primarily been running in the 150 260 range. For the last 2 days that her blood pressure has looked great. I encouraged her to follow it daily. If it starts to creep back up encourage her to try the losartan. She did pick it up from the pharmacy but did not start it. The hydrochlorothiazide only for when necessary use if she starts to notice some lower chin the swelling. She has not had any palms with this in several months.  Kidney pain-she says she stopped the medication because her kidneys were hurting. It does seem to have resolved since stopping the medication but with some increased fatigue over the last few days and slightly lower blood pressure I recommended that we do a  urinalysis. It was positive for leukocytes. We'll send  culture.

## 2015-02-18 LAB — URINE CULTURE: Colony Count: 25000

## 2015-04-01 ENCOUNTER — Ambulatory Visit: Payer: Medicare Other | Admitting: Family Medicine

## 2015-04-12 ENCOUNTER — Other Ambulatory Visit: Payer: Self-pay | Admitting: Family Medicine

## 2015-04-12 NOTE — Telephone Encounter (Signed)
Spoke with Pt, this Rx is normally written by her Dermatologist and unsure why the refill was sent to [place]. Pt uses this Rx for the dry skin on her legs due to poor circulation. Pt does need a refill. I advised I would route to PCP for approval since we have never written it before. Verbalized understanding, no further questions.

## 2015-04-20 ENCOUNTER — Ambulatory Visit (INDEPENDENT_AMBULATORY_CARE_PROVIDER_SITE_OTHER): Payer: Medicare Other | Admitting: Family Medicine

## 2015-04-20 ENCOUNTER — Encounter: Payer: Self-pay | Admitting: Family Medicine

## 2015-04-20 ENCOUNTER — Ambulatory Visit (INDEPENDENT_AMBULATORY_CARE_PROVIDER_SITE_OTHER): Payer: Medicare Other

## 2015-04-20 VITALS — BP 122/76 | HR 66 | Ht 67.0 in | Wt 169.0 lb

## 2015-04-20 DIAGNOSIS — I10 Essential (primary) hypertension: Secondary | ICD-10-CM | POA: Diagnosis not present

## 2015-04-20 DIAGNOSIS — M25561 Pain in right knee: Secondary | ICD-10-CM

## 2015-04-20 DIAGNOSIS — M546 Pain in thoracic spine: Secondary | ICD-10-CM | POA: Diagnosis not present

## 2015-04-20 DIAGNOSIS — M25562 Pain in left knee: Secondary | ICD-10-CM

## 2015-04-20 DIAGNOSIS — M5489 Other dorsalgia: Secondary | ICD-10-CM | POA: Diagnosis not present

## 2015-04-20 DIAGNOSIS — M25559 Pain in unspecified hip: Secondary | ICD-10-CM | POA: Diagnosis not present

## 2015-04-20 MED ORDER — DICLOFENAC SODIUM 2 % TD SOLN: 2.0000 | Freq: Two times a day (BID) | TRANSDERMAL | Status: DC | PRN

## 2015-04-20 MED ORDER — LOSARTAN POTASSIUM 25 MG PO TABS: 25.0000 mg | ORAL_TABLET | Freq: Every day | ORAL | Status: DC

## 2015-04-20 NOTE — Progress Notes (Signed)
   Subjective:    Patient ID: Brandy Tran, female    DOB: November 18, 1921, 81 y.o.   MRN: [id number]  HPI Follow-up hypertension-I last saw her in February we put her on lisinopril. Unfortunately she felt like it was causing some back pain so she called in we discontinued it. She does not take her hydrochlorothiazide daily but just uses it as needed for swelling. At that time her blood pressures were running in the 140s over 80s to 90s. I encouraged her to keep her current regimen until she followed back up. But lately her home blood pressures have been running in the 150s over the 70s.   Stil has a lot of hip and back pain. Has a spot that protrudes on her thoracic spine she would like me to look at.  Says she is just used to being in pain most days.  Occ knees bother her as well.    Review of Systems     Objective:   Physical Exam  Constitutional: She is oriented to person, place, and time. She appears well-developed and well-nourished.  HENT:  Head: Normocephalic and atraumatic.  Cardiovascular: Normal rate, regular rhythm and normal heart sounds.   Pulmonary/Chest: Effort normal and breath sounds normal.  Musculoskeletal:  Prominent protrusion of the spinous process at mid thoracic spine just belwo the bra strap.  It is mildly tender to palpation.   Neurological: She is alert and oriented to person, place, and time.  Skin: Skin is warm and dry.  Psychiatric: She has a normal mood and affect. Her behavior is normal.          Assessment & Plan:  HTN - Will try adding losartan to her regimen. If she tolerates that well we might be able to bump the dose a little and completely get rid of the amlodipine. We had reduced the dose because the 5 and 10 mg doses were causing some ankle swelling. This has seemed to be much better and she has not needed to use the hydromorphone thiazide at all for lower extremity swelling.   Thoracic back pain-will ahead and get x-ray today since she does have  a significant protrusion over the pedicle on the vertebrae just below the bra strap in the mid thoracic area. It is probably just a little bit of arthritis but since she has noticed over the last couple months and it feels sore at times we will get the x-ray.  Hip and knee pain - will try Pennsaid which is topical and doesn't have systemic side effects.

## 2015-05-07 ENCOUNTER — Other Ambulatory Visit: Payer: Self-pay | Admitting: Family Medicine

## 2015-05-13 ENCOUNTER — Telehealth: Payer: Self-pay | Admitting: Family Medicine

## 2015-05-13 MED ORDER — DICLOFENAC SODIUM 1 % TD GEL: 4.0000 g | Freq: Four times a day (QID) | TRANSDERMAL | Status: AC

## 2015-05-13 NOTE — Telephone Encounter (Signed)
Called and lvm informing pt's daughter of medication change.Brandy Tran [place]

## 2015-05-13 NOTE — Telephone Encounter (Signed)
Call pt: we got notice her insurance won't cover the Pennsaid. Will try volteran gel and see if covered. It is a very similar product. Will send to [facility].

## 2015-05-19 ENCOUNTER — Telehealth: Payer: Self-pay | Admitting: Family Medicine

## 2015-05-19 NOTE — Telephone Encounter (Signed)
Received fax from pharmacy for Voltaren 1% gel. I called [employer] at [phone] and medication is approved until 05/18/2016. - CF

## 2015-05-19 NOTE — Telephone Encounter (Signed)
Received fax from [facility] for approval on Voltaren gel 1% reference # [id number] good until 05/18/2016 under her medicare part D plan - CF

## 2015-06-21 ENCOUNTER — Other Ambulatory Visit: Payer: Self-pay | Admitting: Family Medicine

## 2015-06-21 ENCOUNTER — Ambulatory Visit (INDEPENDENT_AMBULATORY_CARE_PROVIDER_SITE_OTHER): Payer: Medicare Other

## 2015-06-21 ENCOUNTER — Encounter: Payer: Self-pay | Admitting: Family Medicine

## 2015-06-21 ENCOUNTER — Ambulatory Visit (INDEPENDENT_AMBULATORY_CARE_PROVIDER_SITE_OTHER): Payer: Medicare Other | Admitting: Family Medicine

## 2015-06-21 VITALS — BP 160/82 | HR 65 | Ht 67.0 in | Wt 171.0 lb

## 2015-06-21 DIAGNOSIS — M5441 Lumbago with sciatica, right side: Secondary | ICD-10-CM | POA: Diagnosis not present

## 2015-06-21 DIAGNOSIS — I10 Essential (primary) hypertension: Secondary | ICD-10-CM | POA: Diagnosis not present

## 2015-06-21 DIAGNOSIS — M8588 Other specified disorders of bone density and structure, other site: Secondary | ICD-10-CM | POA: Diagnosis not present

## 2015-06-21 DIAGNOSIS — M4856XA Collapsed vertebra, not elsewhere classified, lumbar region, initial encounter for fracture: Secondary | ICD-10-CM | POA: Diagnosis not present

## 2015-06-21 DIAGNOSIS — S32028D Other fracture of second lumbar vertebra, subsequent encounter for fracture with routine healing: Secondary | ICD-10-CM | POA: Diagnosis not present

## 2015-06-21 DIAGNOSIS — X58XXXD Exposure to other specified factors, subsequent encounter: Secondary | ICD-10-CM

## 2015-06-21 MED ORDER — KETOROLAC TROMETHAMINE 60 MG/2ML IM SOLN
60.0000 mg | Freq: Once | INTRAMUSCULAR | Status: AC
  Administered 2015-06-21: 60 mg via INTRAMUSCULAR

## 2015-06-21 MED ORDER — PROMETHAZINE HCL 25 MG PO TABS: 12.5000 mg | ORAL_TABLET | Freq: Four times a day (QID) | ORAL | Status: DC | PRN

## 2015-06-21 MED ORDER — TIZANIDINE HCL 2 MG PO CAPS: 2.0000 mg | ORAL_CAPSULE | Freq: Three times a day (TID) | ORAL | Status: DC | PRN

## 2015-06-21 NOTE — Progress Notes (Signed)
   Subjective:    Patient ID: Brandy Tran, female    DOB: Jan 26, 1922, 81 y.o.   MRN: [id number]  HPI Hypertension- Pt denies chest pain, SOB, dizziness, or heart palpitations.  Taking meds as directed w/o problems.  Denies medication side effects.  Feels like her heart has been racing.    Says was getting up from using teh bathroom about 4 days ago and got sudden pain in her posterior right hip and radiated down her leg  Tried tramadol and that made her vomit. Then tried oxycodone made her vomit as well.    Didn't pick up the volteran gel yet bc of cost. Rates her pain 9/10.   Review of Systems     Objective:   Physical Exam  Constitutional: She is oriented to person, place, and time. She appears well-developed and well-nourished.  HENT:  Head: Normocephalic and atraumatic.  Cardiovascular: Normal rate, regular rhythm and normal heart sounds.   Pulmonary/Chest: Effort normal and breath sounds normal.  Musculoskeletal:  Non-tender lumbar spine. Mildly tender along the right paraspinous muscles and right SI joint.    Neurological: She is alert and oriented to person, place, and time.  Skin: Skin is warm and dry.  Psychiatric: She has a normal mood and affect. Her behavior is normal.          Assessment & Plan:  HTN - Uncontrolled. Secondary to pain today.    Acute right SI joint pain - given toradol injection for acute relief. Didn't have much to offer her for pain relief. Can try phenergan with the oxycodone. She is able to take it occ but when takes several does in a row is when she gets nauseated.  Ice the area. Work on gentle stretches. Will get xray bc of age and abrupt onset and level of pain. Recommend low dose of Aleve once a day with food and water.

## 2015-06-21 NOTE — Addendum Note (Signed)
Addended by: Teddy Spike on: 06/21/2015 02:58 PM   Modules accepted: Orders

## 2015-06-28 ENCOUNTER — Ambulatory Visit: Payer: Self-pay | Admitting: Family Medicine

## 2015-07-04 ENCOUNTER — Other Ambulatory Visit: Payer: Self-pay | Admitting: Family Medicine

## 2015-07-06 ENCOUNTER — Ambulatory Visit: Payer: Self-pay | Admitting: Family Medicine

## 2015-07-09 ENCOUNTER — Ambulatory Visit (INDEPENDENT_AMBULATORY_CARE_PROVIDER_SITE_OTHER): Payer: Medicare Other | Admitting: Family Medicine

## 2015-07-09 ENCOUNTER — Encounter: Payer: Self-pay | Admitting: Family Medicine

## 2015-07-09 VITALS — BP 150/76 | HR 106 | Temp 98.2°F

## 2015-07-09 DIAGNOSIS — R0789 Other chest pain: Secondary | ICD-10-CM | POA: Diagnosis not present

## 2015-07-09 DIAGNOSIS — R110 Nausea: Secondary | ICD-10-CM | POA: Diagnosis not present

## 2015-07-09 DIAGNOSIS — M5441 Lumbago with sciatica, right side: Secondary | ICD-10-CM

## 2015-07-09 MED ORDER — PROMETHAZINE HCL 25 MG/ML IJ SOLN
25.0000 mg | Freq: Once | INTRAMUSCULAR | Status: AC
  Administered 2015-07-09: 25 mg via INTRAMUSCULAR

## 2015-07-09 MED ORDER — OMEPRAZOLE 40 MG PO CPDR: 40.0000 mg | DELAYED_RELEASE_CAPSULE | Freq: Every day | ORAL | Status: DC

## 2015-07-09 MED ORDER — KETOROLAC TROMETHAMINE 60 MG/2ML IM SOLN
60.0000 mg | Freq: Once | INTRAMUSCULAR | Status: AC
  Administered 2015-07-09: 60 mg via INTRAMUSCULAR

## 2015-07-09 NOTE — Progress Notes (Signed)
   Subjective:    Patient ID: Brandy Tran, female    DOB: 14-Apr-1922, 81 y.o.   MRN: [id number]  HPI  Follow-up back pain-she still in a lot of discomfort with her low back. She rates her pain an 8 out of 10 today. She was not able to tolerate any other pain medications because of nausea so we have written a perception for Phenergan so that she can at least take an occasional oxycodone if needed. We discussed icing the area stretches. And Aleve for pain relief. We did do x-rays which showed diffuse degenerative change with osteopenia and some aortic atherosclerotic vascular disease. The back pain is keeping her from walking. Her orhto is Dr. Venetia Night.  Stopped her Aleve.    2 days of nausea but no vomiting.  She is having a lot of indigestion. Burping at lot.  Having pain in her mid esphagus radiating to her upper back just below the scapula.  No diarrhea.  He denies any constipation or blood in the stool. She's no longer taking the Aleve. Just using Tylenol as needed.  Review of Systems     Objective:   Physical Exam  Constitutional: She is oriented to person, place, and time. She appears well-developed and well-nourished.  HENT:  Head: Normocephalic and atraumatic.  Cardiovascular: Normal rate, regular rhythm and normal heart sounds.   Pulmonary/Chest: Effort normal and breath sounds normal.  Musculoskeletal:  Nontender over the thoracic spine but she is very tender just to the left of the spine between the shoulder blade and the spine, near the edge of the scapula. Left shoulder with fairly normal range of motion. She has some significant kyphosis of the upper spine.  Neurological: She is alert and oriented to person, place, and time.  Skin: Skin is warm and dry.  Psychiatric: She has a normal mood and affect. Her behavior is normal.          Assessment & Plan:  Right low back pain with radiculopathy-we'll try to get her in with her orthopedist. I was hoping that 3 weeks out she  would at least have some reduction in symptoms. She has not had any relief and now it's affecting her ability to walk.   Nausea-she is not taking the Aleve and is actually only been using Tylenol the last few days she's having some esophageal discomfort in the mid chest. I am concerned because it's radiating towards her back. Will do an EKG and cardiac enzymes today. I'm going to start her on a proton pump inhibitor. He can also use her nausea medicine as well as. She had been taking it when she takes the oxycodone she ran out she quit taking the Phenergan. She denies any constipation.  Atypical chest pain-most likely related to esophagitis versus GERD. EKG shows rate of 95 bpm with normal sinus rhythm. Left bundle branch block which is not new. And she did have a PVC on the EKG. Start her on omeprazole. Take about 30 minutes before first meal the day. New prescription sent to the pharmacy.

## 2015-07-10 LAB — CK TOTAL AND CKMB (NOT AT ARMC)
CK TOTAL: 40 U/L (ref 7–177)
CK, MB: 1.3 ng/mL (ref 0.0–5.0)
Relative Index: 3.3 (ref 0.0–4.0)

## 2015-07-10 LAB — COMPLETE METABOLIC PANEL WITH GFR
ALBUMIN: 3.9 g/dL (ref 3.6–5.1)
ALK PHOS: 144 U/L — AB (ref 33–130)
ALT: 9 U/L (ref 6–29)
AST: 17 U/L (ref 10–35)
BILIRUBIN TOTAL: 0.6 mg/dL (ref 0.2–1.2)
BUN: 15 mg/dL (ref 7–25)
CO2: 29 mmol/L (ref 20–31)
Calcium: 9.2 mg/dL (ref 8.6–10.4)
Chloride: 96 mmol/L — ABNORMAL LOW (ref 98–110)
Creat: 1.01 mg/dL — ABNORMAL HIGH (ref 0.60–0.88)
GFR, EST AFRICAN AMERICAN: 56 mL/min — AB (ref >=60)
GFR, EST NON AFRICAN AMERICAN: 49 mL/min — AB (ref >=60)
GLUCOSE: 109 mg/dL — AB (ref 65–99)
Potassium: 4.2 mmol/L (ref 3.5–5.3)
Sodium: 136 mmol/L (ref 135–146)
TOTAL PROTEIN: 7.2 g/dL (ref 6.1–8.1)

## 2015-07-10 LAB — AMYLASE: Amylase: 28 U/L (ref 0–105)

## 2015-07-10 LAB — LIPASE: Lipase: 18 U/L (ref 7–60)

## 2015-07-10 LAB — TROPONIN I: Troponin I: 0.08 ng/mL — ABNORMAL HIGH (ref <0.06)

## 2015-07-11 DIAGNOSIS — Z90710 Acquired absence of both cervix and uterus: Secondary | ICD-10-CM | POA: Diagnosis not present

## 2015-07-11 DIAGNOSIS — R112 Nausea with vomiting, unspecified: Secondary | ICD-10-CM | POA: Diagnosis not present

## 2015-07-11 DIAGNOSIS — Z880 Allergy status to penicillin: Secondary | ICD-10-CM | POA: Diagnosis not present

## 2015-07-11 DIAGNOSIS — Z91048 Other nonmedicinal substance allergy status: Secondary | ICD-10-CM | POA: Diagnosis not present

## 2015-07-11 DIAGNOSIS — Z96643 Presence of artificial hip joint, bilateral: Secondary | ICD-10-CM | POA: Diagnosis not present

## 2015-07-11 DIAGNOSIS — Z881 Allergy status to other antibiotic agents status: Secondary | ICD-10-CM | POA: Diagnosis not present

## 2015-07-11 DIAGNOSIS — I10 Essential (primary) hypertension: Secondary | ICD-10-CM | POA: Diagnosis not present

## 2015-07-11 DIAGNOSIS — Z883 Allergy status to other anti-infective agents status: Secondary | ICD-10-CM | POA: Diagnosis not present

## 2015-07-11 DIAGNOSIS — E079 Disorder of thyroid, unspecified: Secondary | ICD-10-CM | POA: Diagnosis not present

## 2015-07-11 DIAGNOSIS — R319 Hematuria, unspecified: Secondary | ICD-10-CM | POA: Diagnosis not present

## 2015-07-11 DIAGNOSIS — M40209 Unspecified kyphosis, site unspecified: Secondary | ICD-10-CM | POA: Diagnosis not present

## 2015-07-11 DIAGNOSIS — N390 Urinary tract infection, site not specified: Secondary | ICD-10-CM | POA: Diagnosis not present

## 2015-07-11 DIAGNOSIS — Z79899 Other long term (current) drug therapy: Secondary | ICD-10-CM | POA: Diagnosis not present

## 2015-07-14 ENCOUNTER — Telehealth: Payer: Self-pay | Admitting: Family Medicine

## 2015-07-14 NOTE — Telephone Encounter (Signed)
Please call patient: Evidently she was in the emergency room over the weekend for a urinary tract infection. They had recommended that she follow-up. Please see when she is going to complete her antibiotic and we can try to get her in within a couple days of when she completes them.

## 2015-07-14 NOTE — Telephone Encounter (Signed)
Called pt she stated that the pain is better. I asked her how many days she has left on her abx she informed me that she has 3. I told her to have her daughter to call and make an appt for her to be seen next week. Maryruth Eve, [place]

## 2015-07-16 DIAGNOSIS — S32020A Wedge compression fracture of second lumbar vertebra, initial encounter for closed fracture: Secondary | ICD-10-CM | POA: Diagnosis not present

## 2015-07-16 DIAGNOSIS — M545 Low back pain: Secondary | ICD-10-CM | POA: Diagnosis not present

## 2015-07-20 DIAGNOSIS — E039 Hypothyroidism, unspecified: Secondary | ICD-10-CM | POA: Diagnosis not present

## 2015-07-20 DIAGNOSIS — I10 Essential (primary) hypertension: Secondary | ICD-10-CM | POA: Diagnosis not present

## 2015-07-20 DIAGNOSIS — K219 Gastro-esophageal reflux disease without esophagitis: Secondary | ICD-10-CM | POA: Diagnosis not present

## 2015-07-20 DIAGNOSIS — R079 Chest pain, unspecified: Secondary | ICD-10-CM | POA: Diagnosis not present

## 2015-07-20 DIAGNOSIS — K439 Ventral hernia without obstruction or gangrene: Secondary | ICD-10-CM | POA: Diagnosis not present

## 2015-07-20 DIAGNOSIS — R112 Nausea with vomiting, unspecified: Secondary | ICD-10-CM | POA: Diagnosis not present

## 2015-07-20 DIAGNOSIS — L0291 Cutaneous abscess, unspecified: Secondary | ICD-10-CM | POA: Diagnosis not present

## 2015-07-20 DIAGNOSIS — K469 Unspecified abdominal hernia without obstruction or gangrene: Secondary | ICD-10-CM | POA: Diagnosis not present

## 2015-07-20 DIAGNOSIS — K432 Incisional hernia without obstruction or gangrene: Secondary | ICD-10-CM | POA: Diagnosis not present

## 2015-07-20 DIAGNOSIS — N281 Cyst of kidney, acquired: Secondary | ICD-10-CM | POA: Diagnosis not present

## 2015-07-20 DIAGNOSIS — I209 Angina pectoris, unspecified: Secondary | ICD-10-CM | POA: Diagnosis not present

## 2015-07-20 DIAGNOSIS — R0989 Other specified symptoms and signs involving the circulatory and respiratory systems: Secondary | ICD-10-CM | POA: Diagnosis not present

## 2015-07-20 DIAGNOSIS — K5901 Slow transit constipation: Secondary | ICD-10-CM | POA: Diagnosis not present

## 2015-07-20 DIAGNOSIS — R0789 Other chest pain: Secondary | ICD-10-CM | POA: Diagnosis not present

## 2015-07-21 DIAGNOSIS — R079 Chest pain, unspecified: Secondary | ICD-10-CM | POA: Diagnosis not present

## 2015-07-21 DIAGNOSIS — K5901 Slow transit constipation: Secondary | ICD-10-CM | POA: Diagnosis not present

## 2015-07-21 DIAGNOSIS — E560 Deficiency of vitamin E: Secondary | ICD-10-CM | POA: Diagnosis not present

## 2015-07-21 DIAGNOSIS — E039 Hypothyroidism, unspecified: Secondary | ICD-10-CM | POA: Diagnosis not present

## 2015-07-21 DIAGNOSIS — R112 Nausea with vomiting, unspecified: Secondary | ICD-10-CM | POA: Diagnosis present

## 2015-07-21 DIAGNOSIS — Z9049 Acquired absence of other specified parts of digestive tract: Secondary | ICD-10-CM | POA: Diagnosis not present

## 2015-07-21 DIAGNOSIS — I447 Left bundle-branch block, unspecified: Secondary | ICD-10-CM | POA: Diagnosis not present

## 2015-07-21 DIAGNOSIS — G8929 Other chronic pain: Secondary | ICD-10-CM | POA: Diagnosis not present

## 2015-07-21 DIAGNOSIS — I10 Essential (primary) hypertension: Secondary | ICD-10-CM | POA: Diagnosis not present

## 2015-07-21 DIAGNOSIS — Z881 Allergy status to other antibiotic agents status: Secondary | ICD-10-CM | POA: Diagnosis not present

## 2015-07-21 DIAGNOSIS — I2510 Atherosclerotic heart disease of native coronary artery without angina pectoris: Secondary | ICD-10-CM | POA: Diagnosis not present

## 2015-07-21 DIAGNOSIS — Z96642 Presence of left artificial hip joint: Secondary | ICD-10-CM | POA: Diagnosis not present

## 2015-07-21 DIAGNOSIS — K5790 Diverticulosis of intestine, part unspecified, without perforation or abscess without bleeding: Secondary | ICD-10-CM | POA: Diagnosis not present

## 2015-07-21 DIAGNOSIS — Z90710 Acquired absence of both cervix and uterus: Secondary | ICD-10-CM | POA: Diagnosis not present

## 2015-07-21 DIAGNOSIS — K460 Unspecified abdominal hernia with obstruction, without gangrene: Secondary | ICD-10-CM | POA: Diagnosis not present

## 2015-07-21 DIAGNOSIS — Z888 Allergy status to other drugs, medicaments and biological substances status: Secondary | ICD-10-CM | POA: Diagnosis not present

## 2015-07-21 DIAGNOSIS — N281 Cyst of kidney, acquired: Secondary | ICD-10-CM | POA: Diagnosis not present

## 2015-07-21 DIAGNOSIS — M6281 Muscle weakness (generalized): Secondary | ICD-10-CM | POA: Diagnosis not present

## 2015-07-21 DIAGNOSIS — J9811 Atelectasis: Secondary | ICD-10-CM | POA: Diagnosis not present

## 2015-07-21 DIAGNOSIS — Z87440 Personal history of urinary (tract) infections: Secondary | ICD-10-CM | POA: Diagnosis not present

## 2015-07-21 DIAGNOSIS — K219 Gastro-esophageal reflux disease without esophagitis: Secondary | ICD-10-CM | POA: Diagnosis present

## 2015-07-21 DIAGNOSIS — K436 Other and unspecified ventral hernia with obstruction, without gangrene: Secondary | ICD-10-CM | POA: Diagnosis not present

## 2015-07-21 DIAGNOSIS — Z880 Allergy status to penicillin: Secondary | ICD-10-CM | POA: Diagnosis not present

## 2015-07-21 DIAGNOSIS — M5135 Other intervertebral disc degeneration, thoracolumbar region: Secondary | ICD-10-CM | POA: Diagnosis not present

## 2015-07-21 DIAGNOSIS — K432 Incisional hernia without obstruction or gangrene: Secondary | ICD-10-CM | POA: Diagnosis present

## 2015-07-21 DIAGNOSIS — E784 Other hyperlipidemia: Secondary | ICD-10-CM | POA: Diagnosis not present

## 2015-07-21 DIAGNOSIS — I209 Angina pectoris, unspecified: Secondary | ICD-10-CM | POA: Diagnosis not present

## 2015-07-21 DIAGNOSIS — R109 Unspecified abdominal pain: Secondary | ICD-10-CM | POA: Diagnosis not present

## 2015-07-22 ENCOUNTER — Ambulatory Visit: Payer: Self-pay | Admitting: Family Medicine

## 2015-07-23 ENCOUNTER — Ambulatory Visit: Payer: Self-pay | Admitting: Family Medicine

## 2015-07-25 DIAGNOSIS — I2510 Atherosclerotic heart disease of native coronary artery without angina pectoris: Secondary | ICD-10-CM | POA: Diagnosis not present

## 2015-07-25 DIAGNOSIS — I209 Angina pectoris, unspecified: Secondary | ICD-10-CM | POA: Diagnosis not present

## 2015-07-25 DIAGNOSIS — I10 Essential (primary) hypertension: Secondary | ICD-10-CM | POA: Diagnosis not present

## 2015-07-25 DIAGNOSIS — G8929 Other chronic pain: Secondary | ICD-10-CM | POA: Diagnosis not present

## 2015-07-25 DIAGNOSIS — K436 Other and unspecified ventral hernia with obstruction, without gangrene: Secondary | ICD-10-CM | POA: Diagnosis not present

## 2015-07-25 DIAGNOSIS — K460 Unspecified abdominal hernia with obstruction, without gangrene: Secondary | ICD-10-CM | POA: Diagnosis not present

## 2015-07-25 DIAGNOSIS — K219 Gastro-esophageal reflux disease without esophagitis: Secondary | ICD-10-CM | POA: Diagnosis not present

## 2015-07-25 DIAGNOSIS — R079 Chest pain, unspecified: Secondary | ICD-10-CM | POA: Diagnosis not present

## 2015-07-25 DIAGNOSIS — K5900 Constipation, unspecified: Secondary | ICD-10-CM | POA: Diagnosis not present

## 2015-07-25 DIAGNOSIS — M5135 Other intervertebral disc degeneration, thoracolumbar region: Secondary | ICD-10-CM | POA: Diagnosis not present

## 2015-07-25 DIAGNOSIS — Z9049 Acquired absence of other specified parts of digestive tract: Secondary | ICD-10-CM | POA: Diagnosis not present

## 2015-07-25 DIAGNOSIS — Z96642 Presence of left artificial hip joint: Secondary | ICD-10-CM | POA: Diagnosis not present

## 2015-07-25 DIAGNOSIS — K5790 Diverticulosis of intestine, part unspecified, without perforation or abscess without bleeding: Secondary | ICD-10-CM | POA: Diagnosis not present

## 2015-07-25 DIAGNOSIS — I447 Left bundle-branch block, unspecified: Secondary | ICD-10-CM | POA: Diagnosis not present

## 2015-07-25 DIAGNOSIS — E784 Other hyperlipidemia: Secondary | ICD-10-CM | POA: Diagnosis not present

## 2015-07-25 DIAGNOSIS — E039 Hypothyroidism, unspecified: Secondary | ICD-10-CM | POA: Diagnosis not present

## 2015-07-25 DIAGNOSIS — Z90710 Acquired absence of both cervix and uterus: Secondary | ICD-10-CM | POA: Diagnosis not present

## 2015-07-25 DIAGNOSIS — K5901 Slow transit constipation: Secondary | ICD-10-CM | POA: Diagnosis not present

## 2015-07-25 DIAGNOSIS — E560 Deficiency of vitamin E: Secondary | ICD-10-CM | POA: Diagnosis not present

## 2015-07-25 DIAGNOSIS — N281 Cyst of kidney, acquired: Secondary | ICD-10-CM | POA: Diagnosis not present

## 2015-07-25 DIAGNOSIS — M6281 Muscle weakness (generalized): Secondary | ICD-10-CM | POA: Diagnosis not present

## 2015-07-25 DIAGNOSIS — Z87440 Personal history of urinary (tract) infections: Secondary | ICD-10-CM | POA: Diagnosis not present

## 2015-07-28 DIAGNOSIS — I10 Essential (primary) hypertension: Secondary | ICD-10-CM | POA: Diagnosis not present

## 2015-07-28 DIAGNOSIS — K5900 Constipation, unspecified: Secondary | ICD-10-CM | POA: Diagnosis not present

## 2015-07-28 DIAGNOSIS — E039 Hypothyroidism, unspecified: Secondary | ICD-10-CM | POA: Diagnosis not present

## 2015-07-28 DIAGNOSIS — I2510 Atherosclerotic heart disease of native coronary artery without angina pectoris: Secondary | ICD-10-CM | POA: Diagnosis not present

## 2015-07-28 DIAGNOSIS — I209 Angina pectoris, unspecified: Secondary | ICD-10-CM | POA: Diagnosis not present

## 2015-07-29 ENCOUNTER — Ambulatory Visit: Payer: Self-pay | Admitting: Family Medicine

## 2015-08-03 ENCOUNTER — Ambulatory Visit: Payer: Self-pay | Admitting: Family Medicine

## 2015-08-11 ENCOUNTER — Telehealth: Payer: Self-pay | Admitting: *Deleted

## 2015-08-12 NOTE — Telephone Encounter (Signed)
Pt's daughter called and lvm wanting to know if her mother got a flu shot at our office. I called her back and lvm informing her that she did not.Audelia Hives [place]

## 2015-08-20 ENCOUNTER — Other Ambulatory Visit: Payer: Self-pay | Admitting: Family Medicine

## 2015-09-21 ENCOUNTER — Ambulatory Visit (INDEPENDENT_AMBULATORY_CARE_PROVIDER_SITE_OTHER): Payer: Medicare Other | Admitting: Family Medicine

## 2015-09-21 ENCOUNTER — Encounter: Payer: Self-pay | Admitting: Family Medicine

## 2015-09-21 VITALS — BP 163/65 | HR 72 | Ht 67.0 in | Wt 167.7 lb

## 2015-09-21 DIAGNOSIS — Z23 Encounter for immunization: Secondary | ICD-10-CM | POA: Diagnosis not present

## 2015-09-21 DIAGNOSIS — E039 Hypothyroidism, unspecified: Secondary | ICD-10-CM | POA: Diagnosis not present

## 2015-09-21 DIAGNOSIS — K436 Other and unspecified ventral hernia with obstruction, without gangrene: Secondary | ICD-10-CM | POA: Diagnosis not present

## 2015-09-21 DIAGNOSIS — I10 Essential (primary) hypertension: Secondary | ICD-10-CM

## 2015-09-21 DIAGNOSIS — E559 Vitamin D deficiency, unspecified: Secondary | ICD-10-CM

## 2015-09-21 DIAGNOSIS — I2510 Atherosclerotic heart disease of native coronary artery without angina pectoris: Secondary | ICD-10-CM

## 2015-09-21 LAB — BASIC METABOLIC PANEL
BUN: 18 mg/dL (ref 7–25)
CHLORIDE: 100 mmol/L (ref 98–110)
CO2: 28 mmol/L (ref 20–31)
CREATININE: 0.86 mg/dL (ref 0.60–0.88)
Calcium: 9.5 mg/dL (ref 8.6–10.4)
Glucose, Bld: 101 mg/dL — ABNORMAL HIGH (ref 65–99)
Potassium: 4.5 mmol/L (ref 3.5–5.3)
SODIUM: 139 mmol/L (ref 135–146)

## 2015-09-21 NOTE — Assessment & Plan Note (Signed)
Stress test performed during hospitalization in September 2016 showed some mild reversibility in the inferior lateral walls on nuclear stress test.

## 2015-09-21 NOTE — Progress Notes (Signed)
   Subjective:    Patient ID: Brandy Tran, female    DOB: 05/25/1922, 81 y.o.   MRN: [id number]  HPI   Here for hospital F/U.  She got nauseated and couldn't eat.  Went to ED and was told had an kidney infection. She was discharged home.  Went back a week later she was still sick and she went back and they admitted her.  She was admitted for 7 days at [facility]. Then d/c home to nursing home. She was at [facility] for 6 weeks and she is now walking better.  She has been feeling some better.  Sh is now staying with her daughter.  She is now using her miralax daily.   She also had chest pain while she was there. She had negative enzymes for acute coronary syndrome but she underwent a nuclear stress test that showed some mild reversibility in the inferior and lateral walls. He had a discussion with the family and I called me here at the office to discuss whether not we would just control this medically or whether not to move forward with a heart catheterization. Based on her age and fragility we decided to just do medical management. I think this is the best situation. She's not had any chest pain since then.  Hypothyroidism - they increased her thyroid med from 88 mcg to 170mcg at hospital D/C.    Dx with CAD while there. They started her on ASA 81mg  ED and atorvastatin. She is not taking either one.    She reports that while she was in the skilled nursing facility start on vitamin D. She's not sure why. She doesn't know if she actually has a deficiency.  Review of Systems     Objective:   Physical Exam  Constitutional: She is oriented to person, place, and time. She appears well-developed and well-nourished.  HENT:  Head: Normocephalic and atraumatic.  Cardiovascular: Normal rate and regular rhythm.   Murmur heard. 2/6 SEM.   Pulmonary/Chest: Effort normal and breath sounds normal.  Musculoskeletal: She exhibits edema.  Trace ankle edema bilatearlly   Neurological: She is alert and oriented to  person, place, and time.  Skin: Skin is warm and dry.  Psychiatric: She has a normal mood and affect. Her behavior is normal.          Assessment & Plan:  Incarcerated ventral hernia - resolved. She says her belly feels much better. Doesn't feel nearly as swollen.   Constipation - doing well with miralax daily.  Continue with regimen.  Hypothyroid - she is now on 18mcg daily.  Will recheck her level TSH level.    Will check for vitamin D deficiency. Please see note above.  Coronary artery disease-we'll start low-dose statin. Reminded her to get enteric-coated. If it starts upset her stomach then she can stop it immediately. Also recommend that she start the atorvastatin. Explained how medications can sometimes stabilize plaque and keep coronary artery disease from progressing. She is really not a candidate for a cardiac catheterization.

## 2015-09-22 LAB — VITAMIN D 25 HYDROXY (VIT D DEFICIENCY, FRACTURES): VIT D 25 HYDROXY: 36 ng/mL (ref 30–100)

## 2015-09-23 LAB — TSH: TSH: 0.677 u[IU]/mL (ref 0.350–4.500)

## 2015-10-13 ENCOUNTER — Other Ambulatory Visit: Payer: Self-pay | Admitting: Family Medicine

## 2015-10-19 ENCOUNTER — Other Ambulatory Visit: Payer: Self-pay

## 2015-10-19 MED ORDER — NITROGLYCERIN 0.2 MG/HR TD PT24: MEDICATED_PATCH | TRANSDERMAL | Status: AC

## 2015-11-02 ENCOUNTER — Ambulatory Visit: Payer: PRIVATE HEALTH INSURANCE | Admitting: Family Medicine

## 2015-12-20 ENCOUNTER — Ambulatory Visit (INDEPENDENT_AMBULATORY_CARE_PROVIDER_SITE_OTHER): Payer: Medicare Other | Admitting: Family Medicine

## 2015-12-20 ENCOUNTER — Encounter: Payer: Self-pay | Admitting: Family Medicine

## 2015-12-20 VITALS — BP 137/64 | HR 66 | Wt 170.0 lb

## 2015-12-20 DIAGNOSIS — I2510 Atherosclerotic heart disease of native coronary artery without angina pectoris: Secondary | ICD-10-CM | POA: Diagnosis not present

## 2015-12-20 DIAGNOSIS — R5383 Other fatigue: Secondary | ICD-10-CM

## 2015-12-20 DIAGNOSIS — I10 Essential (primary) hypertension: Secondary | ICD-10-CM | POA: Diagnosis not present

## 2015-12-20 DIAGNOSIS — E038 Other specified hypothyroidism: Secondary | ICD-10-CM | POA: Diagnosis not present

## 2015-12-20 NOTE — Patient Instructions (Signed)
See what dose of metoprolol you are taking?

## 2015-12-20 NOTE — Progress Notes (Signed)
   Subjective:    Patient ID: Brandy Tran, female    DOB: 11-May-1922, 82 y.o.   MRN: [id number]  HPI Hypothyroid - taking her thyroid medicationhe has felt more tired since her recent hospitalization. She just feels like she has gotten back to her baseline energy levels. That she is very sedentary at this point and has a lot of kyphosis of her upper thoracic spine.  Hypertension- Pt denies chest pain, SOB, dizziness, or heart palpitations.  Taking meds as directed w/o problems.  Denies medication side effects.    CAD - she was supposed to start on a statin in November. She decided not to and doesn't want to take one.    Review of Systems     Objective:   Physical Exam  Constitutional: She is oriented to person, place, and time. She appears well-developed and well-nourished.  HENT:  Head: Normocephalic and atraumatic.  Cardiovascular: Normal rate, regular rhythm and normal heart sounds.   Pulmonary/Chest: Effort normal and breath sounds normal.  Neurological: She is alert and oriented to person, place, and time.  Skin: Skin is warm and dry.  Psychiatric: She has a normal mood and affect. Her behavior is normal.          Assessment & Plan:  Hypertension-l controlled. Continue current regimen. Thus I think she is just on the metoprolol. We did discuss that certainly beta blockers can cause some fatigue is up at some point she wanted to try decreasing her dose and adding a separate medication we could certainly do that.here was also a little bit of a question of what dose ofmetoprolol she is actually taking so I did encourage her to check at home andmake sure it's consistent with what we have.  coronary artery disease-she is on a beta blocker but declines to take a statin.  Hypothyroidism-just want to make sure that she is actually taking her thyroid medicine. Can certainly that could lead to fatigue as well.  [place] likely secondary to deconditioning from recent  hospitalization. I encouraged her to walk and stay as active as possible. And certainly we could make some adjustments to her medication regimen as listed above if she would prefer. She declines at this point in time.

## 2015-12-22 ENCOUNTER — Encounter: Payer: Self-pay | Admitting: Family Medicine

## 2015-12-22 ENCOUNTER — Telehealth: Payer: Self-pay | Admitting: Family Medicine

## 2015-12-22 MED ORDER — LEVOTHYROXINE SODIUM 100 MCG PO TABS: 100.0000 ug | ORAL_TABLET | Freq: Every day | ORAL | Status: DC

## 2015-12-22 NOTE — Telephone Encounter (Signed)
Please call patient and verify that she is taking her thyroid medication. Accidentally removed from her medication list yesterday.

## 2015-12-27 NOTE — Telephone Encounter (Signed)
Called the patient's daughterSonia Baller) and the patient is taking Levothyroxine 15mcg, not 1110mcg. Also she states the patient is taking amlodipine 2.5 mg in addition to the metoprolol 100mg . The patient mistakenly stated she wasn't taking amlodipine when she was in the office last.

## 2015-12-28 NOTE — Telephone Encounter (Signed)
Ok, sounds good.  Please update the medication list. Thank you.

## 2015-12-29 MED ORDER — LEVOTHYROXINE SODIUM 88 MCG PO TABS: 88.0000 ug | ORAL_TABLET | Freq: Every day | ORAL | Status: DC

## 2015-12-29 MED ORDER — AMLODIPINE BESYLATE 2.5 MG PO TABS: 2.5000 mg | ORAL_TABLET | Freq: Every day | ORAL | Status: DC

## 2015-12-29 NOTE — Telephone Encounter (Signed)
Med list updated.Brandy Tran [place]

## 2016-01-10 ENCOUNTER — Other Ambulatory Visit: Payer: Self-pay | Admitting: Family Medicine

## 2016-01-12 ENCOUNTER — Other Ambulatory Visit: Payer: Self-pay | Admitting: Family Medicine

## 2016-02-01 ENCOUNTER — Ambulatory Visit: Payer: PRIVATE HEALTH INSURANCE | Admitting: Family Medicine

## 2016-02-11 ENCOUNTER — Ambulatory Visit: Payer: PRIVATE HEALTH INSURANCE | Admitting: Family Medicine

## 2016-02-14 ENCOUNTER — Encounter: Payer: Self-pay | Admitting: Family Medicine

## 2016-02-14 ENCOUNTER — Ambulatory Visit (INDEPENDENT_AMBULATORY_CARE_PROVIDER_SITE_OTHER): Payer: Medicare Other | Admitting: Family Medicine

## 2016-02-14 VITALS — BP 130/76 | HR 81 | Wt 170.0 lb

## 2016-02-14 DIAGNOSIS — M549 Dorsalgia, unspecified: Secondary | ICD-10-CM | POA: Diagnosis not present

## 2016-02-14 DIAGNOSIS — R531 Weakness: Secondary | ICD-10-CM

## 2016-02-14 DIAGNOSIS — G8929 Other chronic pain: Secondary | ICD-10-CM | POA: Diagnosis not present

## 2016-02-14 DIAGNOSIS — I2510 Atherosclerotic heart disease of native coronary artery without angina pectoris: Secondary | ICD-10-CM | POA: Diagnosis not present

## 2016-02-14 NOTE — Progress Notes (Signed)
   Subjective:    Patient ID: Brandy Tran, female    DOB: 05/16/1922, 82 y.o.   MRN: [id number]  HPI Patient comes in today. She is actually moved into a senior care apartment. It is not assisted living or a nursing home. It's just apartment for seniors. But she has had a special letter allowing her daughter to actually live with her since she does require 24-hour in-home care. She has a diagnosis of back pain and leg pain and chronic kidney disease. She does not drive and needs help with her medications. Currently she is living with her daughter. Her daughter feels like this would be a good move for her. She is a very social person and loves being around people and this would give her an opportunity to do that. Down the road they are looking at assisted living and possibly nursing home care and we had a frank discussion about that today as well.   Review of Systems     Objective:   Physical Exam  Constitutional: She is oriented to person, place, and time. She appears well-developed and well-nourished.  Using a rolling walker  HENT:  Head: Normocephalic and atraumatic.  Eyes: Conjunctivae and EOM are normal.  Cardiovascular: Normal rate.   Pulmonary/Chest: Effort normal.  Neurological: She is alert and oriented to person, place, and time.  Skin: Skin is dry. No pallor.  Psychiatric: She has a normal mood and affect. Her behavior is normal.  Vitals reviewed.      Assessment & Plan:  Generalized weakness, chronic back pain, kidney disease-Letter provided allowing her daughter to live with her.. Call if any problems or needs any additional information.  Time spent 50 minutes, greater than 50% time spent counseling about life planning.

## 2016-03-03 ENCOUNTER — Other Ambulatory Visit: Payer: Self-pay

## 2016-03-03 MED ORDER — AMLODIPINE BESYLATE 2.5 MG PO TABS: 2.5000 mg | ORAL_TABLET | Freq: Every day | ORAL | Status: DC

## 2016-03-23 ENCOUNTER — Ambulatory Visit: Payer: PRIVATE HEALTH INSURANCE | Admitting: Family Medicine

## 2016-04-11 ENCOUNTER — Other Ambulatory Visit: Payer: Self-pay | Admitting: Family Medicine

## 2016-04-14 ENCOUNTER — Ambulatory Visit: Payer: PRIVATE HEALTH INSURANCE | Admitting: Family Medicine

## 2016-04-14 ENCOUNTER — Other Ambulatory Visit: Payer: Self-pay | Admitting: Family Medicine

## 2016-05-15 DIAGNOSIS — E039 Hypothyroidism, unspecified: Secondary | ICD-10-CM | POA: Diagnosis not present

## 2016-05-15 DIAGNOSIS — I209 Angina pectoris, unspecified: Secondary | ICD-10-CM | POA: Diagnosis not present

## 2016-05-15 DIAGNOSIS — I10 Essential (primary) hypertension: Secondary | ICD-10-CM | POA: Diagnosis not present

## 2016-05-15 DIAGNOSIS — M549 Dorsalgia, unspecified: Secondary | ICD-10-CM | POA: Diagnosis not present

## 2016-05-15 DIAGNOSIS — M40209 Unspecified kyphosis, site unspecified: Secondary | ICD-10-CM | POA: Diagnosis not present

## 2016-05-18 ENCOUNTER — Ambulatory Visit (INDEPENDENT_AMBULATORY_CARE_PROVIDER_SITE_OTHER): Payer: Medicare Other | Admitting: Family Medicine

## 2016-05-18 NOTE — Progress Notes (Signed)
No Show.  Beatrice Lecher, MD

## 2016-06-01 DIAGNOSIS — M40205 Unspecified kyphosis, thoracolumbar region: Secondary | ICD-10-CM | POA: Diagnosis not present

## 2016-06-01 DIAGNOSIS — S32000S Wedge compression fracture of unspecified lumbar vertebra, sequela: Secondary | ICD-10-CM | POA: Diagnosis not present

## 2016-06-01 DIAGNOSIS — M549 Dorsalgia, unspecified: Secondary | ICD-10-CM | POA: Diagnosis not present

## 2016-06-01 DIAGNOSIS — M4806 Spinal stenosis, lumbar region: Secondary | ICD-10-CM | POA: Diagnosis not present

## 2016-06-07 DIAGNOSIS — M461 Sacroiliitis, not elsewhere classified: Secondary | ICD-10-CM | POA: Diagnosis not present

## 2016-06-07 DIAGNOSIS — M545 Low back pain: Secondary | ICD-10-CM | POA: Diagnosis not present

## 2016-07-12 ENCOUNTER — Other Ambulatory Visit: Payer: Self-pay | Admitting: Family Medicine

## 2016-07-14 ENCOUNTER — Other Ambulatory Visit: Payer: Self-pay | Admitting: Family Medicine

## 2016-08-14 DIAGNOSIS — I209 Angina pectoris, unspecified: Secondary | ICD-10-CM | POA: Diagnosis not present

## 2016-08-14 DIAGNOSIS — R531 Weakness: Secondary | ICD-10-CM | POA: Diagnosis not present

## 2016-08-14 DIAGNOSIS — I10 Essential (primary) hypertension: Secondary | ICD-10-CM | POA: Diagnosis not present

## 2016-08-14 DIAGNOSIS — K5901 Slow transit constipation: Secondary | ICD-10-CM | POA: Diagnosis not present

## 2016-08-14 DIAGNOSIS — E039 Hypothyroidism, unspecified: Secondary | ICD-10-CM | POA: Diagnosis not present

## 2016-09-14 ENCOUNTER — Other Ambulatory Visit: Payer: Self-pay | Admitting: Family Medicine

## 2016-12-27 DIAGNOSIS — M545 Low back pain: Secondary | ICD-10-CM | POA: Diagnosis not present

## 2016-12-27 DIAGNOSIS — M4807 Spinal stenosis, lumbosacral region: Secondary | ICD-10-CM | POA: Diagnosis not present

## 2017-01-29 DIAGNOSIS — M40205 Unspecified kyphosis, thoracolumbar region: Secondary | ICD-10-CM | POA: Diagnosis not present

## 2017-01-29 DIAGNOSIS — E039 Hypothyroidism, unspecified: Secondary | ICD-10-CM | POA: Diagnosis not present

## 2017-01-29 DIAGNOSIS — G8929 Other chronic pain: Secondary | ICD-10-CM | POA: Diagnosis not present

## 2017-01-29 DIAGNOSIS — M545 Low back pain: Secondary | ICD-10-CM | POA: Diagnosis not present

## 2017-01-29 DIAGNOSIS — K5901 Slow transit constipation: Secondary | ICD-10-CM | POA: Diagnosis not present

## 2017-01-29 DIAGNOSIS — I10 Essential (primary) hypertension: Secondary | ICD-10-CM | POA: Diagnosis not present

## 2017-02-08 DIAGNOSIS — M4807 Spinal stenosis, lumbosacral region: Secondary | ICD-10-CM | POA: Diagnosis not present

## 2017-02-15 IMAGING — CR DG LUMBAR SPINE 2-3V
3 series · 3 of 3 positions shown · non-contrast
Comparison: 08/04/2014 .

CLINICAL DATA: Right-sided back pain for 4 days.

EXAM:
LUMBAR SPINE - 2-3 VIEW

[l-spine ap]
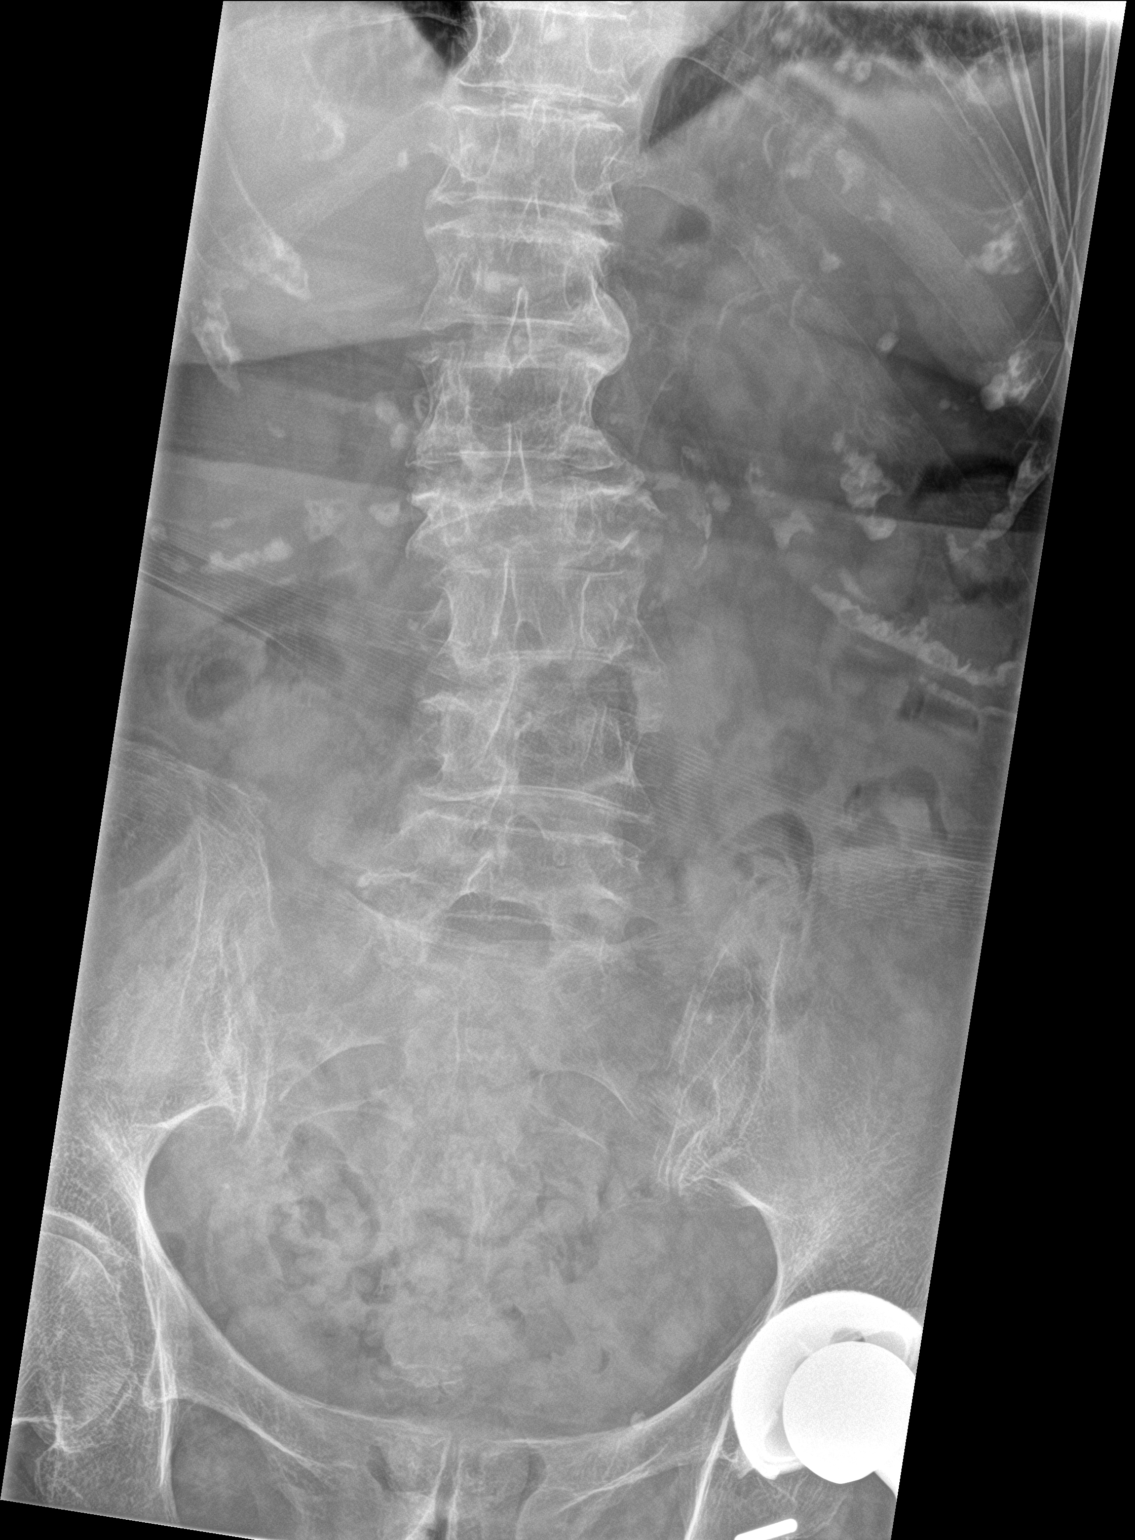

[l-spine obl (1 of 2)]
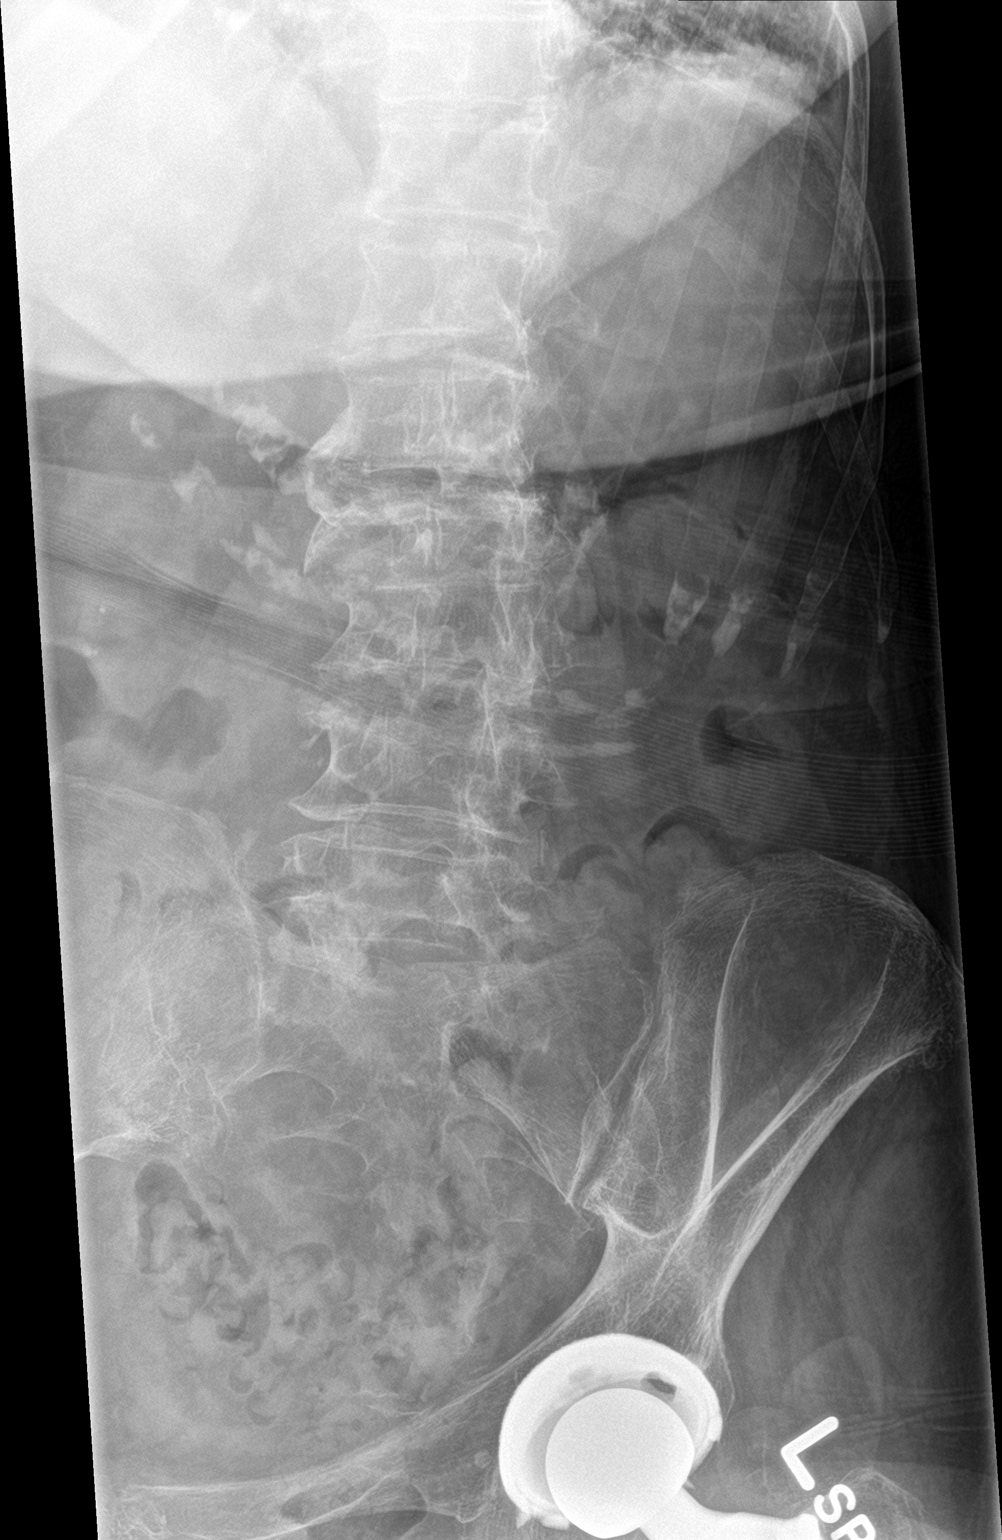

[l-spine obl (2 of 2)]
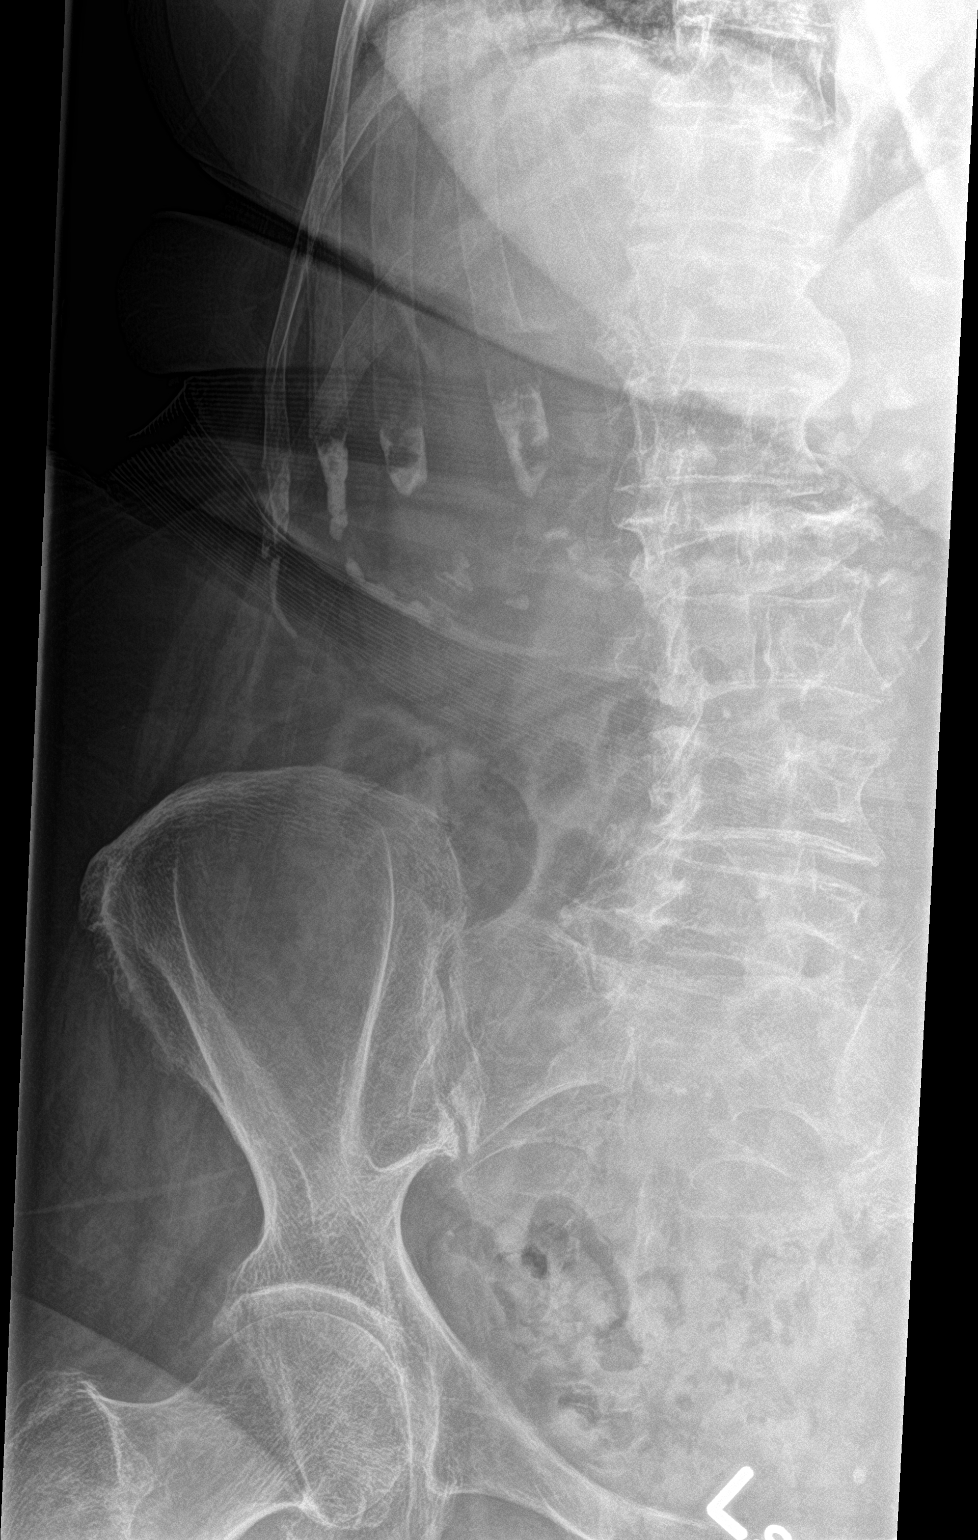

[3 of 3 positions shown; findings below may reference images not displayed]

FINDINGS: Exam limited as a lateral view not obtained. Diffuse osteopenia and
degenerative change. No acute bony abnormality identified. Diffuse
multilevel degenerative change. L2 compression fracture again noted.
Left hip replacement. Aortoiliac atherosclerotic vascular disease.
IMPRESSION: 1. Exam is limited as a lateral view not obtained. L2 compression
fracture again noted. If symptoms persist a lateral view of lumbar
spine suggested.
2. Diffuse osteopenia degenerative change.
3. Aortoiliac atherosclerotic vascular disease.

## 2017-03-26 DIAGNOSIS — C44119 Basal cell carcinoma of skin of left eyelid, including canthus: Secondary | ICD-10-CM | POA: Diagnosis not present

## 2017-03-26 DIAGNOSIS — D485 Neoplasm of uncertain behavior of skin: Secondary | ICD-10-CM | POA: Diagnosis not present

## 2017-05-11 DIAGNOSIS — L259 Unspecified contact dermatitis, unspecified cause: Secondary | ICD-10-CM | POA: Diagnosis not present

## 2017-05-11 DIAGNOSIS — L219 Seborrheic dermatitis, unspecified: Secondary | ICD-10-CM | POA: Diagnosis not present

## 2017-05-11 DIAGNOSIS — B372 Candidiasis of skin and nail: Secondary | ICD-10-CM | POA: Diagnosis not present

## 2017-05-30 DIAGNOSIS — Z85828 Personal history of other malignant neoplasm of skin: Secondary | ICD-10-CM | POA: Diagnosis not present

## 2017-05-30 DIAGNOSIS — C44119 Basal cell carcinoma of skin of left eyelid, including canthus: Secondary | ICD-10-CM | POA: Diagnosis not present

## 2017-05-30 DIAGNOSIS — M952 Other acquired deformity of head: Secondary | ICD-10-CM | POA: Diagnosis not present

## 2017-06-04 DIAGNOSIS — I10 Essential (primary) hypertension: Secondary | ICD-10-CM | POA: Diagnosis not present

## 2017-06-04 DIAGNOSIS — Z481 Encounter for planned postprocedural wound closure: Secondary | ICD-10-CM | POA: Diagnosis not present

## 2017-06-04 DIAGNOSIS — Z483 Aftercare following surgery for neoplasm: Secondary | ICD-10-CM | POA: Diagnosis not present

## 2017-06-04 DIAGNOSIS — I2510 Atherosclerotic heart disease of native coronary artery without angina pectoris: Secondary | ICD-10-CM | POA: Diagnosis not present

## 2017-06-04 DIAGNOSIS — M952 Other acquired deformity of head: Secondary | ICD-10-CM | POA: Diagnosis not present

## 2017-06-04 DIAGNOSIS — I447 Left bundle-branch block, unspecified: Secondary | ICD-10-CM | POA: Diagnosis not present

## 2017-06-04 DIAGNOSIS — Z85828 Personal history of other malignant neoplasm of skin: Secondary | ICD-10-CM | POA: Diagnosis not present

## 2017-06-04 DIAGNOSIS — H0289 Other specified disorders of eyelid: Secondary | ICD-10-CM | POA: Diagnosis not present

## 2017-06-04 DIAGNOSIS — Z428 Encounter for other plastic and reconstructive surgery following medical procedure or healed injury: Secondary | ICD-10-CM | POA: Diagnosis not present

## 2017-06-04 DIAGNOSIS — C44119 Basal cell carcinoma of skin of left eyelid, including canthus: Secondary | ICD-10-CM | POA: Diagnosis not present

## 2017-06-04 DIAGNOSIS — E039 Hypothyroidism, unspecified: Secondary | ICD-10-CM | POA: Diagnosis not present

## 2017-06-18 DIAGNOSIS — E039 Hypothyroidism, unspecified: Secondary | ICD-10-CM | POA: Diagnosis not present

## 2017-06-18 DIAGNOSIS — M545 Low back pain: Secondary | ICD-10-CM | POA: Diagnosis not present

## 2017-06-18 DIAGNOSIS — R531 Weakness: Secondary | ICD-10-CM | POA: Diagnosis not present

## 2017-06-18 DIAGNOSIS — G8929 Other chronic pain: Secondary | ICD-10-CM | POA: Diagnosis not present

## 2017-06-18 DIAGNOSIS — I10 Essential (primary) hypertension: Secondary | ICD-10-CM | POA: Diagnosis not present

## 2017-07-17 DIAGNOSIS — R1030 Lower abdominal pain, unspecified: Secondary | ICD-10-CM | POA: Diagnosis not present

## 2017-07-17 DIAGNOSIS — K5909 Other constipation: Secondary | ICD-10-CM | POA: Diagnosis not present

## 2017-07-20 DIAGNOSIS — G8929 Other chronic pain: Secondary | ICD-10-CM | POA: Diagnosis not present

## 2017-07-20 DIAGNOSIS — R2689 Other abnormalities of gait and mobility: Secondary | ICD-10-CM | POA: Diagnosis not present

## 2017-07-20 DIAGNOSIS — I25119 Atherosclerotic heart disease of native coronary artery with unspecified angina pectoris: Secondary | ICD-10-CM | POA: Diagnosis not present

## 2017-07-20 DIAGNOSIS — I10 Essential (primary) hypertension: Secondary | ICD-10-CM | POA: Diagnosis not present

## 2017-07-21 DIAGNOSIS — E119 Type 2 diabetes mellitus without complications: Secondary | ICD-10-CM | POA: Diagnosis not present

## 2017-07-21 DIAGNOSIS — E785 Hyperlipidemia, unspecified: Secondary | ICD-10-CM | POA: Diagnosis not present

## 2017-07-21 DIAGNOSIS — D519 Vitamin B12 deficiency anemia, unspecified: Secondary | ICD-10-CM | POA: Diagnosis not present

## 2017-07-21 DIAGNOSIS — D649 Anemia, unspecified: Secondary | ICD-10-CM | POA: Diagnosis not present

## 2017-07-21 DIAGNOSIS — E039 Hypothyroidism, unspecified: Secondary | ICD-10-CM | POA: Diagnosis not present

## 2017-07-21 DIAGNOSIS — I10 Essential (primary) hypertension: Secondary | ICD-10-CM | POA: Diagnosis not present

## 2017-07-21 DIAGNOSIS — Z79899 Other long term (current) drug therapy: Secondary | ICD-10-CM | POA: Diagnosis not present

## 2017-07-21 DIAGNOSIS — K769 Liver disease, unspecified: Secondary | ICD-10-CM | POA: Diagnosis not present

## 2017-07-21 DIAGNOSIS — E559 Vitamin D deficiency, unspecified: Secondary | ICD-10-CM | POA: Diagnosis not present

## 2017-07-24 DIAGNOSIS — I10 Essential (primary) hypertension: Secondary | ICD-10-CM | POA: Diagnosis not present

## 2017-07-24 DIAGNOSIS — E559 Vitamin D deficiency, unspecified: Secondary | ICD-10-CM | POA: Diagnosis not present

## 2017-07-24 DIAGNOSIS — K439 Ventral hernia without obstruction or gangrene: Secondary | ICD-10-CM | POA: Diagnosis not present

## 2017-07-24 DIAGNOSIS — S32000D Wedge compression fracture of unspecified lumbar vertebra, subsequent encounter for fracture with routine healing: Secondary | ICD-10-CM | POA: Diagnosis not present

## 2017-07-24 DIAGNOSIS — I209 Angina pectoris, unspecified: Secondary | ICD-10-CM | POA: Diagnosis not present

## 2017-07-24 DIAGNOSIS — K5900 Constipation, unspecified: Secondary | ICD-10-CM | POA: Diagnosis not present

## 2017-07-24 DIAGNOSIS — E039 Hypothyroidism, unspecified: Secondary | ICD-10-CM | POA: Diagnosis not present

## 2017-08-21 DIAGNOSIS — E559 Vitamin D deficiency, unspecified: Secondary | ICD-10-CM | POA: Diagnosis not present

## 2017-08-30 DIAGNOSIS — E039 Hypothyroidism, unspecified: Secondary | ICD-10-CM | POA: Diagnosis not present

## 2017-08-30 DIAGNOSIS — I10 Essential (primary) hypertension: Secondary | ICD-10-CM | POA: Diagnosis not present

## 2017-08-30 DIAGNOSIS — G8929 Other chronic pain: Secondary | ICD-10-CM | POA: Diagnosis not present

## 2017-08-30 DIAGNOSIS — Z23 Encounter for immunization: Secondary | ICD-10-CM | POA: Diagnosis not present

## 2017-08-30 DIAGNOSIS — I209 Angina pectoris, unspecified: Secondary | ICD-10-CM | POA: Diagnosis not present

## 2017-08-30 DIAGNOSIS — I519 Heart disease, unspecified: Secondary | ICD-10-CM | POA: Diagnosis not present

## 2017-09-11 DIAGNOSIS — Z85828 Personal history of other malignant neoplasm of skin: Secondary | ICD-10-CM | POA: Diagnosis not present

## 2017-09-11 DIAGNOSIS — H0289 Other specified disorders of eyelid: Secondary | ICD-10-CM | POA: Diagnosis not present

## 2017-09-18 DIAGNOSIS — H02112 Cicatricial ectropion of right lower eyelid: Secondary | ICD-10-CM | POA: Diagnosis not present

## 2017-09-19 DIAGNOSIS — I10 Essential (primary) hypertension: Secondary | ICD-10-CM | POA: Diagnosis not present

## 2017-09-19 DIAGNOSIS — E559 Vitamin D deficiency, unspecified: Secondary | ICD-10-CM | POA: Diagnosis not present

## 2017-09-19 DIAGNOSIS — R946 Abnormal results of thyroid function studies: Secondary | ICD-10-CM | POA: Diagnosis not present

## 2017-09-19 DIAGNOSIS — H2000 Unspecified acute and subacute iridocyclitis: Secondary | ICD-10-CM | POA: Diagnosis not present

## 2017-09-19 DIAGNOSIS — H109 Unspecified conjunctivitis: Secondary | ICD-10-CM | POA: Diagnosis not present

## 2017-09-19 DIAGNOSIS — G8929 Other chronic pain: Secondary | ICD-10-CM | POA: Diagnosis not present

## 2017-09-19 DIAGNOSIS — I209 Angina pectoris, unspecified: Secondary | ICD-10-CM | POA: Diagnosis not present

## 2017-09-19 DIAGNOSIS — I2510 Atherosclerotic heart disease of native coronary artery without angina pectoris: Secondary | ICD-10-CM | POA: Diagnosis not present

## 2017-10-02 DIAGNOSIS — R2689 Other abnormalities of gait and mobility: Secondary | ICD-10-CM | POA: Diagnosis not present

## 2017-10-02 DIAGNOSIS — G8929 Other chronic pain: Secondary | ICD-10-CM | POA: Diagnosis not present

## 2017-10-02 DIAGNOSIS — I25119 Atherosclerotic heart disease of native coronary artery with unspecified angina pectoris: Secondary | ICD-10-CM | POA: Diagnosis not present

## 2017-10-02 DIAGNOSIS — I10 Essential (primary) hypertension: Secondary | ICD-10-CM | POA: Diagnosis not present

## 2017-10-04 DIAGNOSIS — I10 Essential (primary) hypertension: Secondary | ICD-10-CM | POA: Diagnosis not present

## 2017-10-04 DIAGNOSIS — I25119 Atherosclerotic heart disease of native coronary artery with unspecified angina pectoris: Secondary | ICD-10-CM | POA: Diagnosis not present

## 2017-10-04 DIAGNOSIS — R2689 Other abnormalities of gait and mobility: Secondary | ICD-10-CM | POA: Diagnosis not present

## 2017-10-04 DIAGNOSIS — G8929 Other chronic pain: Secondary | ICD-10-CM | POA: Diagnosis not present

## 2017-10-08 DIAGNOSIS — R2689 Other abnormalities of gait and mobility: Secondary | ICD-10-CM | POA: Diagnosis not present

## 2017-10-08 DIAGNOSIS — I10 Essential (primary) hypertension: Secondary | ICD-10-CM | POA: Diagnosis not present

## 2017-10-08 DIAGNOSIS — I25119 Atherosclerotic heart disease of native coronary artery with unspecified angina pectoris: Secondary | ICD-10-CM | POA: Diagnosis not present

## 2017-10-08 DIAGNOSIS — G8929 Other chronic pain: Secondary | ICD-10-CM | POA: Diagnosis not present

## 2017-10-10 DIAGNOSIS — G8929 Other chronic pain: Secondary | ICD-10-CM | POA: Diagnosis not present

## 2017-10-10 DIAGNOSIS — I25119 Atherosclerotic heart disease of native coronary artery with unspecified angina pectoris: Secondary | ICD-10-CM | POA: Diagnosis not present

## 2017-10-10 DIAGNOSIS — I10 Essential (primary) hypertension: Secondary | ICD-10-CM | POA: Diagnosis not present

## 2017-10-10 DIAGNOSIS — R2689 Other abnormalities of gait and mobility: Secondary | ICD-10-CM | POA: Diagnosis not present

## 2017-10-11 DIAGNOSIS — H02103 Unspecified ectropion of right eye, unspecified eyelid: Secondary | ICD-10-CM | POA: Diagnosis not present

## 2017-10-11 DIAGNOSIS — H02106 Unspecified ectropion of left eye, unspecified eyelid: Secondary | ICD-10-CM | POA: Diagnosis not present

## 2017-10-12 DIAGNOSIS — I10 Essential (primary) hypertension: Secondary | ICD-10-CM | POA: Diagnosis not present

## 2017-10-12 DIAGNOSIS — R2689 Other abnormalities of gait and mobility: Secondary | ICD-10-CM | POA: Diagnosis not present

## 2017-10-12 DIAGNOSIS — G8929 Other chronic pain: Secondary | ICD-10-CM | POA: Diagnosis not present

## 2017-10-12 DIAGNOSIS — I25119 Atherosclerotic heart disease of native coronary artery with unspecified angina pectoris: Secondary | ICD-10-CM | POA: Diagnosis not present

## 2017-10-15 DIAGNOSIS — G8929 Other chronic pain: Secondary | ICD-10-CM | POA: Diagnosis not present

## 2017-10-15 DIAGNOSIS — I25119 Atherosclerotic heart disease of native coronary artery with unspecified angina pectoris: Secondary | ICD-10-CM | POA: Diagnosis not present

## 2017-10-15 DIAGNOSIS — I10 Essential (primary) hypertension: Secondary | ICD-10-CM | POA: Diagnosis not present

## 2017-10-15 DIAGNOSIS — R2689 Other abnormalities of gait and mobility: Secondary | ICD-10-CM | POA: Diagnosis not present

## 2017-10-17 DIAGNOSIS — G8929 Other chronic pain: Secondary | ICD-10-CM | POA: Diagnosis not present

## 2017-10-17 DIAGNOSIS — I25119 Atherosclerotic heart disease of native coronary artery with unspecified angina pectoris: Secondary | ICD-10-CM | POA: Diagnosis not present

## 2017-10-17 DIAGNOSIS — I10 Essential (primary) hypertension: Secondary | ICD-10-CM | POA: Diagnosis not present

## 2017-10-17 DIAGNOSIS — R2689 Other abnormalities of gait and mobility: Secondary | ICD-10-CM | POA: Diagnosis not present

## 2017-10-19 DIAGNOSIS — I10 Essential (primary) hypertension: Secondary | ICD-10-CM | POA: Diagnosis not present

## 2017-10-19 DIAGNOSIS — R2689 Other abnormalities of gait and mobility: Secondary | ICD-10-CM | POA: Diagnosis not present

## 2017-10-19 DIAGNOSIS — G8929 Other chronic pain: Secondary | ICD-10-CM | POA: Diagnosis not present

## 2017-10-19 DIAGNOSIS — I25119 Atherosclerotic heart disease of native coronary artery with unspecified angina pectoris: Secondary | ICD-10-CM | POA: Diagnosis not present

## 2017-10-22 DIAGNOSIS — R2689 Other abnormalities of gait and mobility: Secondary | ICD-10-CM | POA: Diagnosis not present

## 2017-10-22 DIAGNOSIS — G8929 Other chronic pain: Secondary | ICD-10-CM | POA: Diagnosis not present

## 2017-10-22 DIAGNOSIS — I10 Essential (primary) hypertension: Secondary | ICD-10-CM | POA: Diagnosis not present

## 2017-10-22 DIAGNOSIS — I25119 Atherosclerotic heart disease of native coronary artery with unspecified angina pectoris: Secondary | ICD-10-CM | POA: Diagnosis not present

## 2017-10-23 DIAGNOSIS — I25119 Atherosclerotic heart disease of native coronary artery with unspecified angina pectoris: Secondary | ICD-10-CM | POA: Diagnosis not present

## 2017-10-23 DIAGNOSIS — R2689 Other abnormalities of gait and mobility: Secondary | ICD-10-CM | POA: Diagnosis not present

## 2017-10-23 DIAGNOSIS — G8929 Other chronic pain: Secondary | ICD-10-CM | POA: Diagnosis not present

## 2017-10-23 DIAGNOSIS — I10 Essential (primary) hypertension: Secondary | ICD-10-CM | POA: Diagnosis not present

## 2017-10-24 DIAGNOSIS — G8929 Other chronic pain: Secondary | ICD-10-CM | POA: Diagnosis not present

## 2017-10-24 DIAGNOSIS — I10 Essential (primary) hypertension: Secondary | ICD-10-CM | POA: Diagnosis not present

## 2017-10-24 DIAGNOSIS — R2689 Other abnormalities of gait and mobility: Secondary | ICD-10-CM | POA: Diagnosis not present

## 2017-10-24 DIAGNOSIS — I25119 Atherosclerotic heart disease of native coronary artery with unspecified angina pectoris: Secondary | ICD-10-CM | POA: Diagnosis not present

## 2017-10-25 DIAGNOSIS — R2689 Other abnormalities of gait and mobility: Secondary | ICD-10-CM | POA: Diagnosis not present

## 2017-10-25 DIAGNOSIS — G8929 Other chronic pain: Secondary | ICD-10-CM | POA: Diagnosis not present

## 2017-10-25 DIAGNOSIS — I10 Essential (primary) hypertension: Secondary | ICD-10-CM | POA: Diagnosis not present

## 2017-10-25 DIAGNOSIS — I25119 Atherosclerotic heart disease of native coronary artery with unspecified angina pectoris: Secondary | ICD-10-CM | POA: Diagnosis not present

## 2017-11-08 DIAGNOSIS — H02015 Cicatricial entropion of left lower eyelid: Secondary | ICD-10-CM | POA: Diagnosis not present

## 2017-11-08 DIAGNOSIS — H01009 Unspecified blepharitis unspecified eye, unspecified eyelid: Secondary | ICD-10-CM | POA: Diagnosis not present

## 2017-11-16 DIAGNOSIS — M1990 Unspecified osteoarthritis, unspecified site: Secondary | ICD-10-CM | POA: Diagnosis not present

## 2017-11-16 DIAGNOSIS — I10 Essential (primary) hypertension: Secondary | ICD-10-CM | POA: Diagnosis not present

## 2017-11-16 DIAGNOSIS — E039 Hypothyroidism, unspecified: Secondary | ICD-10-CM | POA: Diagnosis not present

## 2017-11-16 DIAGNOSIS — I209 Angina pectoris, unspecified: Secondary | ICD-10-CM | POA: Diagnosis not present

## 2017-11-16 DIAGNOSIS — K5900 Constipation, unspecified: Secondary | ICD-10-CM | POA: Diagnosis not present

## 2017-11-30 DIAGNOSIS — R918 Other nonspecific abnormal finding of lung field: Secondary | ICD-10-CM | POA: Diagnosis not present

## 2017-11-30 DIAGNOSIS — R410 Disorientation, unspecified: Secondary | ICD-10-CM | POA: Diagnosis not present

## 2017-11-30 DIAGNOSIS — E039 Hypothyroidism, unspecified: Secondary | ICD-10-CM | POA: Diagnosis not present

## 2017-11-30 DIAGNOSIS — G8929 Other chronic pain: Secondary | ICD-10-CM | POA: Diagnosis not present

## 2017-11-30 DIAGNOSIS — I351 Nonrheumatic aortic (valve) insufficiency: Secondary | ICD-10-CM | POA: Diagnosis not present

## 2017-11-30 DIAGNOSIS — K436 Other and unspecified ventral hernia with obstruction, without gangrene: Secondary | ICD-10-CM | POA: Diagnosis not present

## 2017-11-30 DIAGNOSIS — I371 Nonrheumatic pulmonary valve insufficiency: Secondary | ICD-10-CM | POA: Diagnosis not present

## 2017-11-30 DIAGNOSIS — M6281 Muscle weakness (generalized): Secondary | ICD-10-CM | POA: Diagnosis not present

## 2017-11-30 DIAGNOSIS — Z883 Allergy status to other anti-infective agents status: Secondary | ICD-10-CM | POA: Diagnosis not present

## 2017-11-30 DIAGNOSIS — R278 Other lack of coordination: Secondary | ICD-10-CM | POA: Diagnosis not present

## 2017-11-30 DIAGNOSIS — I340 Nonrheumatic mitral (valve) insufficiency: Secondary | ICD-10-CM | POA: Diagnosis not present

## 2017-11-30 DIAGNOSIS — Z90710 Acquired absence of both cervix and uterus: Secondary | ICD-10-CM | POA: Diagnosis not present

## 2017-11-30 DIAGNOSIS — Z880 Allergy status to penicillin: Secondary | ICD-10-CM | POA: Diagnosis not present

## 2017-11-30 DIAGNOSIS — Z66 Do not resuscitate: Secondary | ICD-10-CM | POA: Diagnosis not present

## 2017-11-30 DIAGNOSIS — J690 Pneumonitis due to inhalation of food and vomit: Secondary | ICD-10-CM | POA: Diagnosis present

## 2017-11-30 DIAGNOSIS — K449 Diaphragmatic hernia without obstruction or gangrene: Secondary | ICD-10-CM | POA: Diagnosis not present

## 2017-11-30 DIAGNOSIS — R0902 Hypoxemia: Secondary | ICD-10-CM | POA: Diagnosis not present

## 2017-11-30 DIAGNOSIS — M79605 Pain in left leg: Secondary | ICD-10-CM | POA: Diagnosis not present

## 2017-11-30 DIAGNOSIS — R001 Bradycardia, unspecified: Secondary | ICD-10-CM | POA: Diagnosis not present

## 2017-11-30 DIAGNOSIS — I208 Other forms of angina pectoris: Secondary | ICD-10-CM | POA: Diagnosis not present

## 2017-11-30 DIAGNOSIS — Z9049 Acquired absence of other specified parts of digestive tract: Secondary | ICD-10-CM | POA: Diagnosis not present

## 2017-11-30 DIAGNOSIS — I10 Essential (primary) hypertension: Secondary | ICD-10-CM | POA: Diagnosis not present

## 2017-11-30 DIAGNOSIS — R1311 Dysphagia, oral phase: Secondary | ICD-10-CM | POA: Diagnosis not present

## 2017-11-30 DIAGNOSIS — G9340 Encephalopathy, unspecified: Secondary | ICD-10-CM | POA: Diagnosis not present

## 2017-11-30 DIAGNOSIS — R1031 Right lower quadrant pain: Secondary | ICD-10-CM | POA: Diagnosis not present

## 2017-11-30 DIAGNOSIS — J90 Pleural effusion, not elsewhere classified: Secondary | ICD-10-CM | POA: Diagnosis not present

## 2017-11-30 DIAGNOSIS — R079 Chest pain, unspecified: Secondary | ICD-10-CM | POA: Diagnosis not present

## 2017-11-30 DIAGNOSIS — R451 Restlessness and agitation: Secondary | ICD-10-CM | POA: Diagnosis not present

## 2017-11-30 DIAGNOSIS — E876 Hypokalemia: Secondary | ICD-10-CM | POA: Diagnosis not present

## 2017-11-30 DIAGNOSIS — R7989 Other specified abnormal findings of blood chemistry: Secondary | ICD-10-CM | POA: Diagnosis not present

## 2017-11-30 DIAGNOSIS — Z882 Allergy status to sulfonamides status: Secondary | ICD-10-CM | POA: Diagnosis not present

## 2017-11-30 DIAGNOSIS — R4182 Altered mental status, unspecified: Secondary | ICD-10-CM | POA: Diagnosis not present

## 2017-11-30 DIAGNOSIS — I214 Non-ST elevation (NSTEMI) myocardial infarction: Secondary | ICD-10-CM | POA: Diagnosis not present

## 2017-11-30 DIAGNOSIS — I447 Left bundle-branch block, unspecified: Secondary | ICD-10-CM | POA: Diagnosis not present

## 2017-11-30 DIAGNOSIS — Z049 Encounter for examination and observation for unspecified reason: Secondary | ICD-10-CM | POA: Diagnosis not present

## 2017-11-30 DIAGNOSIS — I25119 Atherosclerotic heart disease of native coronary artery with unspecified angina pectoris: Secondary | ICD-10-CM | POA: Diagnosis present

## 2017-11-30 DIAGNOSIS — H9190 Unspecified hearing loss, unspecified ear: Secondary | ICD-10-CM | POA: Diagnosis not present

## 2017-11-30 DIAGNOSIS — Z91048 Other nonmedicinal substance allergy status: Secondary | ICD-10-CM | POA: Diagnosis not present

## 2017-11-30 DIAGNOSIS — R072 Precordial pain: Secondary | ICD-10-CM | POA: Diagnosis not present

## 2017-11-30 DIAGNOSIS — Z79899 Other long term (current) drug therapy: Secondary | ICD-10-CM | POA: Diagnosis not present

## 2017-11-30 DIAGNOSIS — I071 Rheumatic tricuspid insufficiency: Secondary | ICD-10-CM | POA: Diagnosis not present

## 2017-11-30 DIAGNOSIS — R2689 Other abnormalities of gait and mobility: Secondary | ICD-10-CM | POA: Diagnosis not present

## 2017-11-30 DIAGNOSIS — G43A0 Cyclical vomiting, not intractable: Secondary | ICD-10-CM | POA: Diagnosis not present

## 2017-12-02 DIAGNOSIS — I447 Left bundle-branch block, unspecified: Secondary | ICD-10-CM | POA: Diagnosis not present

## 2017-12-04 DIAGNOSIS — I447 Left bundle-branch block, unspecified: Secondary | ICD-10-CM | POA: Diagnosis not present

## 2017-12-04 DIAGNOSIS — R4182 Altered mental status, unspecified: Secondary | ICD-10-CM | POA: Diagnosis not present

## 2017-12-04 DIAGNOSIS — R0902 Hypoxemia: Secondary | ICD-10-CM | POA: Diagnosis not present

## 2017-12-04 DIAGNOSIS — R1311 Dysphagia, oral phase: Secondary | ICD-10-CM | POA: Diagnosis not present

## 2017-12-04 DIAGNOSIS — I209 Angina pectoris, unspecified: Secondary | ICD-10-CM | POA: Diagnosis not present

## 2017-12-04 DIAGNOSIS — I208 Other forms of angina pectoris: Secondary | ICD-10-CM | POA: Diagnosis not present

## 2017-12-04 DIAGNOSIS — R278 Other lack of coordination: Secondary | ICD-10-CM | POA: Diagnosis not present

## 2017-12-04 DIAGNOSIS — I10 Essential (primary) hypertension: Secondary | ICD-10-CM | POA: Diagnosis not present

## 2017-12-04 DIAGNOSIS — I214 Non-ST elevation (NSTEMI) myocardial infarction: Secondary | ICD-10-CM | POA: Diagnosis not present

## 2017-12-04 DIAGNOSIS — M6281 Muscle weakness (generalized): Secondary | ICD-10-CM | POA: Diagnosis not present

## 2017-12-04 DIAGNOSIS — J690 Pneumonitis due to inhalation of food and vomit: Secondary | ICD-10-CM | POA: Diagnosis not present

## 2017-12-04 DIAGNOSIS — G8929 Other chronic pain: Secondary | ICD-10-CM | POA: Diagnosis not present

## 2017-12-04 DIAGNOSIS — R2689 Other abnormalities of gait and mobility: Secondary | ICD-10-CM | POA: Diagnosis not present

## 2017-12-04 DIAGNOSIS — C4491 Basal cell carcinoma of skin, unspecified: Secondary | ICD-10-CM | POA: Diagnosis not present

## 2017-12-04 DIAGNOSIS — R001 Bradycardia, unspecified: Secondary | ICD-10-CM | POA: Diagnosis not present

## 2017-12-04 DIAGNOSIS — G9340 Encephalopathy, unspecified: Secondary | ICD-10-CM | POA: Diagnosis not present

## 2017-12-04 DIAGNOSIS — I25119 Atherosclerotic heart disease of native coronary artery with unspecified angina pectoris: Secondary | ICD-10-CM | POA: Diagnosis not present

## 2017-12-04 DIAGNOSIS — E039 Hypothyroidism, unspecified: Secondary | ICD-10-CM | POA: Diagnosis not present

## 2017-12-04 DIAGNOSIS — M1990 Unspecified osteoarthritis, unspecified site: Secondary | ICD-10-CM | POA: Diagnosis not present

## 2017-12-04 DIAGNOSIS — Z66 Do not resuscitate: Secondary | ICD-10-CM | POA: Diagnosis not present

## 2017-12-04 DIAGNOSIS — I2510 Atherosclerotic heart disease of native coronary artery without angina pectoris: Secondary | ICD-10-CM | POA: Diagnosis not present

## 2017-12-07 DIAGNOSIS — I10 Essential (primary) hypertension: Secondary | ICD-10-CM | POA: Diagnosis not present

## 2017-12-07 DIAGNOSIS — I447 Left bundle-branch block, unspecified: Secondary | ICD-10-CM | POA: Diagnosis not present

## 2017-12-07 DIAGNOSIS — M1990 Unspecified osteoarthritis, unspecified site: Secondary | ICD-10-CM | POA: Diagnosis not present

## 2017-12-07 DIAGNOSIS — G8929 Other chronic pain: Secondary | ICD-10-CM | POA: Diagnosis not present

## 2017-12-07 DIAGNOSIS — I209 Angina pectoris, unspecified: Secondary | ICD-10-CM | POA: Diagnosis not present

## 2017-12-07 DIAGNOSIS — C4491 Basal cell carcinoma of skin, unspecified: Secondary | ICD-10-CM | POA: Diagnosis not present

## 2017-12-07 DIAGNOSIS — I2510 Atherosclerotic heart disease of native coronary artery without angina pectoris: Secondary | ICD-10-CM | POA: Diagnosis not present

## 2017-12-07 DIAGNOSIS — E039 Hypothyroidism, unspecified: Secondary | ICD-10-CM | POA: Diagnosis not present

## 2017-12-20 DIAGNOSIS — Z66 Do not resuscitate: Secondary | ICD-10-CM | POA: Diagnosis not present

## 2017-12-20 DIAGNOSIS — I10 Essential (primary) hypertension: Secondary | ICD-10-CM | POA: Diagnosis not present

## 2017-12-20 DIAGNOSIS — E039 Hypothyroidism, unspecified: Secondary | ICD-10-CM | POA: Diagnosis not present

## 2017-12-20 DIAGNOSIS — M791 Myalgia, unspecified site: Secondary | ICD-10-CM | POA: Diagnosis not present

## 2017-12-20 DIAGNOSIS — Z049 Encounter for examination and observation for unspecified reason: Secondary | ICD-10-CM | POA: Diagnosis not present

## 2017-12-20 DIAGNOSIS — R918 Other nonspecific abnormal finding of lung field: Secondary | ICD-10-CM | POA: Diagnosis not present

## 2017-12-20 DIAGNOSIS — J9811 Atelectasis: Secondary | ICD-10-CM | POA: Diagnosis not present

## 2017-12-20 DIAGNOSIS — J9601 Acute respiratory failure with hypoxia: Secondary | ICD-10-CM | POA: Diagnosis not present

## 2017-12-20 DIAGNOSIS — J90 Pleural effusion, not elsewhere classified: Secondary | ICD-10-CM | POA: Diagnosis not present

## 2017-12-20 DIAGNOSIS — R52 Pain, unspecified: Secondary | ICD-10-CM | POA: Diagnosis not present

## 2017-12-20 DIAGNOSIS — J029 Acute pharyngitis, unspecified: Secondary | ICD-10-CM | POA: Diagnosis not present

## 2017-12-20 DIAGNOSIS — R42 Dizziness and giddiness: Secondary | ICD-10-CM | POA: Diagnosis not present

## 2017-12-20 DIAGNOSIS — R0902 Hypoxemia: Secondary | ICD-10-CM | POA: Diagnosis not present

## 2017-12-20 DIAGNOSIS — I214 Non-ST elevation (NSTEMI) myocardial infarction: Secondary | ICD-10-CM | POA: Diagnosis not present

## 2017-12-20 DIAGNOSIS — I447 Left bundle-branch block, unspecified: Secondary | ICD-10-CM | POA: Diagnosis not present

## 2017-12-20 DIAGNOSIS — R1310 Dysphagia, unspecified: Secondary | ICD-10-CM | POA: Diagnosis not present

## 2017-12-21 DIAGNOSIS — I214 Non-ST elevation (NSTEMI) myocardial infarction: Secondary | ICD-10-CM | POA: Diagnosis not present

## 2017-12-21 DIAGNOSIS — Z7902 Long term (current) use of antithrombotics/antiplatelets: Secondary | ICD-10-CM | POA: Diagnosis not present

## 2017-12-21 DIAGNOSIS — I447 Left bundle-branch block, unspecified: Secondary | ICD-10-CM | POA: Diagnosis not present

## 2017-12-21 DIAGNOSIS — E039 Hypothyroidism, unspecified: Secondary | ICD-10-CM | POA: Diagnosis not present

## 2017-12-21 DIAGNOSIS — G8929 Other chronic pain: Secondary | ICD-10-CM | POA: Diagnosis not present

## 2017-12-21 DIAGNOSIS — I10 Essential (primary) hypertension: Secondary | ICD-10-CM | POA: Diagnosis not present

## 2017-12-21 DIAGNOSIS — R7989 Other specified abnormal findings of blood chemistry: Secondary | ICD-10-CM | POA: Diagnosis not present

## 2017-12-21 DIAGNOSIS — R1310 Dysphagia, unspecified: Secondary | ICD-10-CM | POA: Diagnosis not present

## 2017-12-21 DIAGNOSIS — I2510 Atherosclerotic heart disease of native coronary artery without angina pectoris: Secondary | ICD-10-CM | POA: Diagnosis not present

## 2017-12-21 DIAGNOSIS — K469 Unspecified abdominal hernia without obstruction or gangrene: Secondary | ICD-10-CM | POA: Diagnosis not present

## 2017-12-21 DIAGNOSIS — Z7982 Long term (current) use of aspirin: Secondary | ICD-10-CM | POA: Diagnosis not present

## 2017-12-22 DIAGNOSIS — R1310 Dysphagia, unspecified: Secondary | ICD-10-CM | POA: Diagnosis not present

## 2017-12-22 DIAGNOSIS — I10 Essential (primary) hypertension: Secondary | ICD-10-CM | POA: Diagnosis not present

## 2017-12-22 DIAGNOSIS — Z7902 Long term (current) use of antithrombotics/antiplatelets: Secondary | ICD-10-CM | POA: Diagnosis not present

## 2017-12-22 DIAGNOSIS — J9811 Atelectasis: Secondary | ICD-10-CM | POA: Diagnosis not present

## 2017-12-22 DIAGNOSIS — E039 Hypothyroidism, unspecified: Secondary | ICD-10-CM | POA: Diagnosis not present

## 2017-12-22 DIAGNOSIS — J029 Acute pharyngitis, unspecified: Secondary | ICD-10-CM | POA: Diagnosis not present

## 2017-12-22 DIAGNOSIS — I214 Non-ST elevation (NSTEMI) myocardial infarction: Secondary | ICD-10-CM | POA: Diagnosis not present

## 2017-12-22 DIAGNOSIS — Z7982 Long term (current) use of aspirin: Secondary | ICD-10-CM | POA: Diagnosis not present

## 2017-12-23 DIAGNOSIS — Z91013 Allergy to seafood: Secondary | ICD-10-CM | POA: Diagnosis not present

## 2017-12-23 DIAGNOSIS — R1310 Dysphagia, unspecified: Secondary | ICD-10-CM | POA: Diagnosis not present

## 2017-12-23 DIAGNOSIS — R0902 Hypoxemia: Secondary | ICD-10-CM | POA: Diagnosis present

## 2017-12-23 DIAGNOSIS — Z7902 Long term (current) use of antithrombotics/antiplatelets: Secondary | ICD-10-CM | POA: Diagnosis not present

## 2017-12-23 DIAGNOSIS — I214 Non-ST elevation (NSTEMI) myocardial infarction: Secondary | ICD-10-CM | POA: Diagnosis present

## 2017-12-23 DIAGNOSIS — J029 Acute pharyngitis, unspecified: Secondary | ICD-10-CM | POA: Diagnosis present

## 2017-12-23 DIAGNOSIS — Z79899 Other long term (current) drug therapy: Secondary | ICD-10-CM | POA: Diagnosis not present

## 2017-12-23 DIAGNOSIS — Z881 Allergy status to other antibiotic agents status: Secondary | ICD-10-CM | POA: Diagnosis not present

## 2017-12-23 DIAGNOSIS — E039 Hypothyroidism, unspecified: Secondary | ICD-10-CM | POA: Diagnosis present

## 2017-12-23 DIAGNOSIS — Z880 Allergy status to penicillin: Secondary | ICD-10-CM | POA: Diagnosis not present

## 2017-12-23 DIAGNOSIS — I10 Essential (primary) hypertension: Secondary | ICD-10-CM | POA: Diagnosis present

## 2017-12-23 DIAGNOSIS — R633 Feeding difficulties: Secondary | ICD-10-CM | POA: Diagnosis not present

## 2017-12-23 DIAGNOSIS — I252 Old myocardial infarction: Secondary | ICD-10-CM | POA: Diagnosis not present

## 2017-12-23 DIAGNOSIS — K0510 Chronic gingivitis, plaque induced: Secondary | ICD-10-CM | POA: Diagnosis present

## 2017-12-23 DIAGNOSIS — R52 Pain, unspecified: Secondary | ICD-10-CM | POA: Diagnosis not present

## 2017-12-23 DIAGNOSIS — Z90710 Acquired absence of both cervix and uterus: Secondary | ICD-10-CM | POA: Diagnosis not present

## 2017-12-23 DIAGNOSIS — J9811 Atelectasis: Secondary | ICD-10-CM | POA: Diagnosis present

## 2017-12-23 DIAGNOSIS — K029 Dental caries, unspecified: Secondary | ICD-10-CM | POA: Diagnosis present

## 2017-12-23 DIAGNOSIS — Z8701 Personal history of pneumonia (recurrent): Secondary | ICD-10-CM | POA: Diagnosis not present

## 2017-12-23 DIAGNOSIS — Z91048 Other nonmedicinal substance allergy status: Secondary | ICD-10-CM | POA: Diagnosis not present

## 2017-12-23 DIAGNOSIS — I25119 Atherosclerotic heart disease of native coronary artery with unspecified angina pectoris: Secondary | ICD-10-CM | POA: Diagnosis present

## 2017-12-23 DIAGNOSIS — Z79890 Hormone replacement therapy (postmenopausal): Secondary | ICD-10-CM | POA: Diagnosis not present

## 2017-12-23 DIAGNOSIS — Z91040 Latex allergy status: Secondary | ICD-10-CM | POA: Diagnosis not present

## 2017-12-23 DIAGNOSIS — Z7982 Long term (current) use of aspirin: Secondary | ICD-10-CM | POA: Diagnosis not present

## 2017-12-23 DIAGNOSIS — Z9049 Acquired absence of other specified parts of digestive tract: Secondary | ICD-10-CM | POA: Diagnosis not present

## 2017-12-23 DIAGNOSIS — Z66 Do not resuscitate: Secondary | ICD-10-CM | POA: Diagnosis present

## 2017-12-25 DIAGNOSIS — E0590 Thyrotoxicosis, unspecified without thyrotoxic crisis or storm: Secondary | ICD-10-CM | POA: Diagnosis not present

## 2017-12-25 DIAGNOSIS — Z79899 Other long term (current) drug therapy: Secondary | ICD-10-CM | POA: Diagnosis not present

## 2017-12-25 DIAGNOSIS — E119 Type 2 diabetes mellitus without complications: Secondary | ICD-10-CM | POA: Diagnosis not present

## 2017-12-25 DIAGNOSIS — D649 Anemia, unspecified: Secondary | ICD-10-CM | POA: Diagnosis not present

## 2017-12-25 DIAGNOSIS — I10 Essential (primary) hypertension: Secondary | ICD-10-CM | POA: Diagnosis not present

## 2017-12-25 DIAGNOSIS — D519 Vitamin B12 deficiency anemia, unspecified: Secondary | ICD-10-CM | POA: Diagnosis not present

## 2017-12-25 DIAGNOSIS — E039 Hypothyroidism, unspecified: Secondary | ICD-10-CM | POA: Diagnosis not present

## 2017-12-25 DIAGNOSIS — E559 Vitamin D deficiency, unspecified: Secondary | ICD-10-CM | POA: Diagnosis not present

## 2017-12-25 DIAGNOSIS — K769 Liver disease, unspecified: Secondary | ICD-10-CM | POA: Diagnosis not present

## 2017-12-25 DIAGNOSIS — E785 Hyperlipidemia, unspecified: Secondary | ICD-10-CM | POA: Diagnosis not present

## 2017-12-28 DIAGNOSIS — E039 Hypothyroidism, unspecified: Secondary | ICD-10-CM | POA: Diagnosis not present

## 2017-12-28 DIAGNOSIS — G8929 Other chronic pain: Secondary | ICD-10-CM | POA: Diagnosis not present

## 2017-12-28 DIAGNOSIS — I10 Essential (primary) hypertension: Secondary | ICD-10-CM | POA: Diagnosis not present

## 2017-12-28 DIAGNOSIS — G9340 Encephalopathy, unspecified: Secondary | ICD-10-CM | POA: Diagnosis not present

## 2017-12-28 DIAGNOSIS — I2510 Atherosclerotic heart disease of native coronary artery without angina pectoris: Secondary | ICD-10-CM | POA: Diagnosis not present

## 2017-12-28 DIAGNOSIS — J690 Pneumonitis due to inhalation of food and vomit: Secondary | ICD-10-CM | POA: Diagnosis not present

## 2017-12-28 DIAGNOSIS — M6281 Muscle weakness (generalized): Secondary | ICD-10-CM | POA: Diagnosis not present

## 2017-12-28 DIAGNOSIS — I214 Non-ST elevation (NSTEMI) myocardial infarction: Secondary | ICD-10-CM | POA: Diagnosis not present

## 2018-01-08 DIAGNOSIS — Z79899 Other long term (current) drug therapy: Secondary | ICD-10-CM | POA: Diagnosis not present

## 2018-01-08 DIAGNOSIS — N390 Urinary tract infection, site not specified: Secondary | ICD-10-CM | POA: Diagnosis not present

## 2018-01-08 DIAGNOSIS — R319 Hematuria, unspecified: Secondary | ICD-10-CM | POA: Diagnosis not present

## 2018-02-01 DIAGNOSIS — E039 Hypothyroidism, unspecified: Secondary | ICD-10-CM | POA: Diagnosis not present

## 2018-02-01 DIAGNOSIS — I214 Non-ST elevation (NSTEMI) myocardial infarction: Secondary | ICD-10-CM | POA: Diagnosis not present

## 2018-02-01 DIAGNOSIS — R1310 Dysphagia, unspecified: Secondary | ICD-10-CM | POA: Diagnosis not present

## 2018-02-01 DIAGNOSIS — I10 Essential (primary) hypertension: Secondary | ICD-10-CM | POA: Diagnosis not present

## 2018-02-11 DIAGNOSIS — R748 Abnormal levels of other serum enzymes: Secondary | ICD-10-CM | POA: Diagnosis not present

## 2018-02-11 DIAGNOSIS — R531 Weakness: Secondary | ICD-10-CM | POA: Diagnosis not present

## 2018-02-11 DIAGNOSIS — I10 Essential (primary) hypertension: Secondary | ICD-10-CM | POA: Diagnosis not present

## 2018-02-11 DIAGNOSIS — E039 Hypothyroidism, unspecified: Secondary | ICD-10-CM | POA: Diagnosis not present

## 2018-02-11 DIAGNOSIS — J9601 Acute respiratory failure with hypoxia: Secondary | ICD-10-CM | POA: Diagnosis not present

## 2018-02-11 DIAGNOSIS — E875 Hyperkalemia: Secondary | ICD-10-CM | POA: Diagnosis not present

## 2018-02-11 DIAGNOSIS — J189 Pneumonia, unspecified organism: Secondary | ICD-10-CM | POA: Diagnosis not present

## 2018-06-03 DIAGNOSIS — I10 Essential (primary) hypertension: Secondary | ICD-10-CM | POA: Diagnosis not present

## 2018-06-03 DIAGNOSIS — M545 Low back pain: Secondary | ICD-10-CM | POA: Diagnosis not present

## 2018-06-03 DIAGNOSIS — Z0000 Encounter for general adult medical examination without abnormal findings: Secondary | ICD-10-CM | POA: Diagnosis not present

## 2018-06-03 DIAGNOSIS — R8290 Unspecified abnormal findings in urine: Secondary | ICD-10-CM | POA: Diagnosis not present

## 2018-06-03 DIAGNOSIS — G8929 Other chronic pain: Secondary | ICD-10-CM | POA: Diagnosis not present

## 2018-06-03 DIAGNOSIS — M79644 Pain in right finger(s): Secondary | ICD-10-CM | POA: Diagnosis not present

## 2018-06-03 DIAGNOSIS — I209 Angina pectoris, unspecified: Secondary | ICD-10-CM | POA: Diagnosis not present

## 2018-06-03 DIAGNOSIS — E039 Hypothyroidism, unspecified: Secondary | ICD-10-CM | POA: Diagnosis not present

## 2018-06-03 DIAGNOSIS — R42 Dizziness and giddiness: Secondary | ICD-10-CM | POA: Diagnosis not present

## 2018-06-03 DIAGNOSIS — M40205 Unspecified kyphosis, thoracolumbar region: Secondary | ICD-10-CM | POA: Diagnosis not present

## 2018-07-12 DIAGNOSIS — R1084 Generalized abdominal pain: Secondary | ICD-10-CM | POA: Diagnosis not present

## 2018-07-16 DIAGNOSIS — M65311 Trigger thumb, right thumb: Secondary | ICD-10-CM | POA: Diagnosis not present

## 2018-07-16 DIAGNOSIS — M79641 Pain in right hand: Secondary | ICD-10-CM | POA: Diagnosis not present
